# Patient Record
Sex: Female | Born: 1970 | Race: White | Hispanic: No | Marital: Married | Smoking: Never smoker
Health system: Southern US, Community
[De-identification: ages and names within clinical notes are randomized; demographics above are authoritative.]

## PROBLEM LIST (undated history)

## (undated) HISTORY — PX: GASTRECTOMY: SHX58

---

## 2012-09-14 ENCOUNTER — Ambulatory Visit: Payer: Self-pay | Admitting: Family Medicine

## 2012-09-14 IMAGING — CR DG CHEST 2V
1 series · 2 of 2 positions shown · non-contrast
Comparison: none

REASON FOR EXAM: night sweats
COMMENTS:

[Series 1: pa · 0.17mm/px · 2 of 2 slices shown]
[im 1/2]
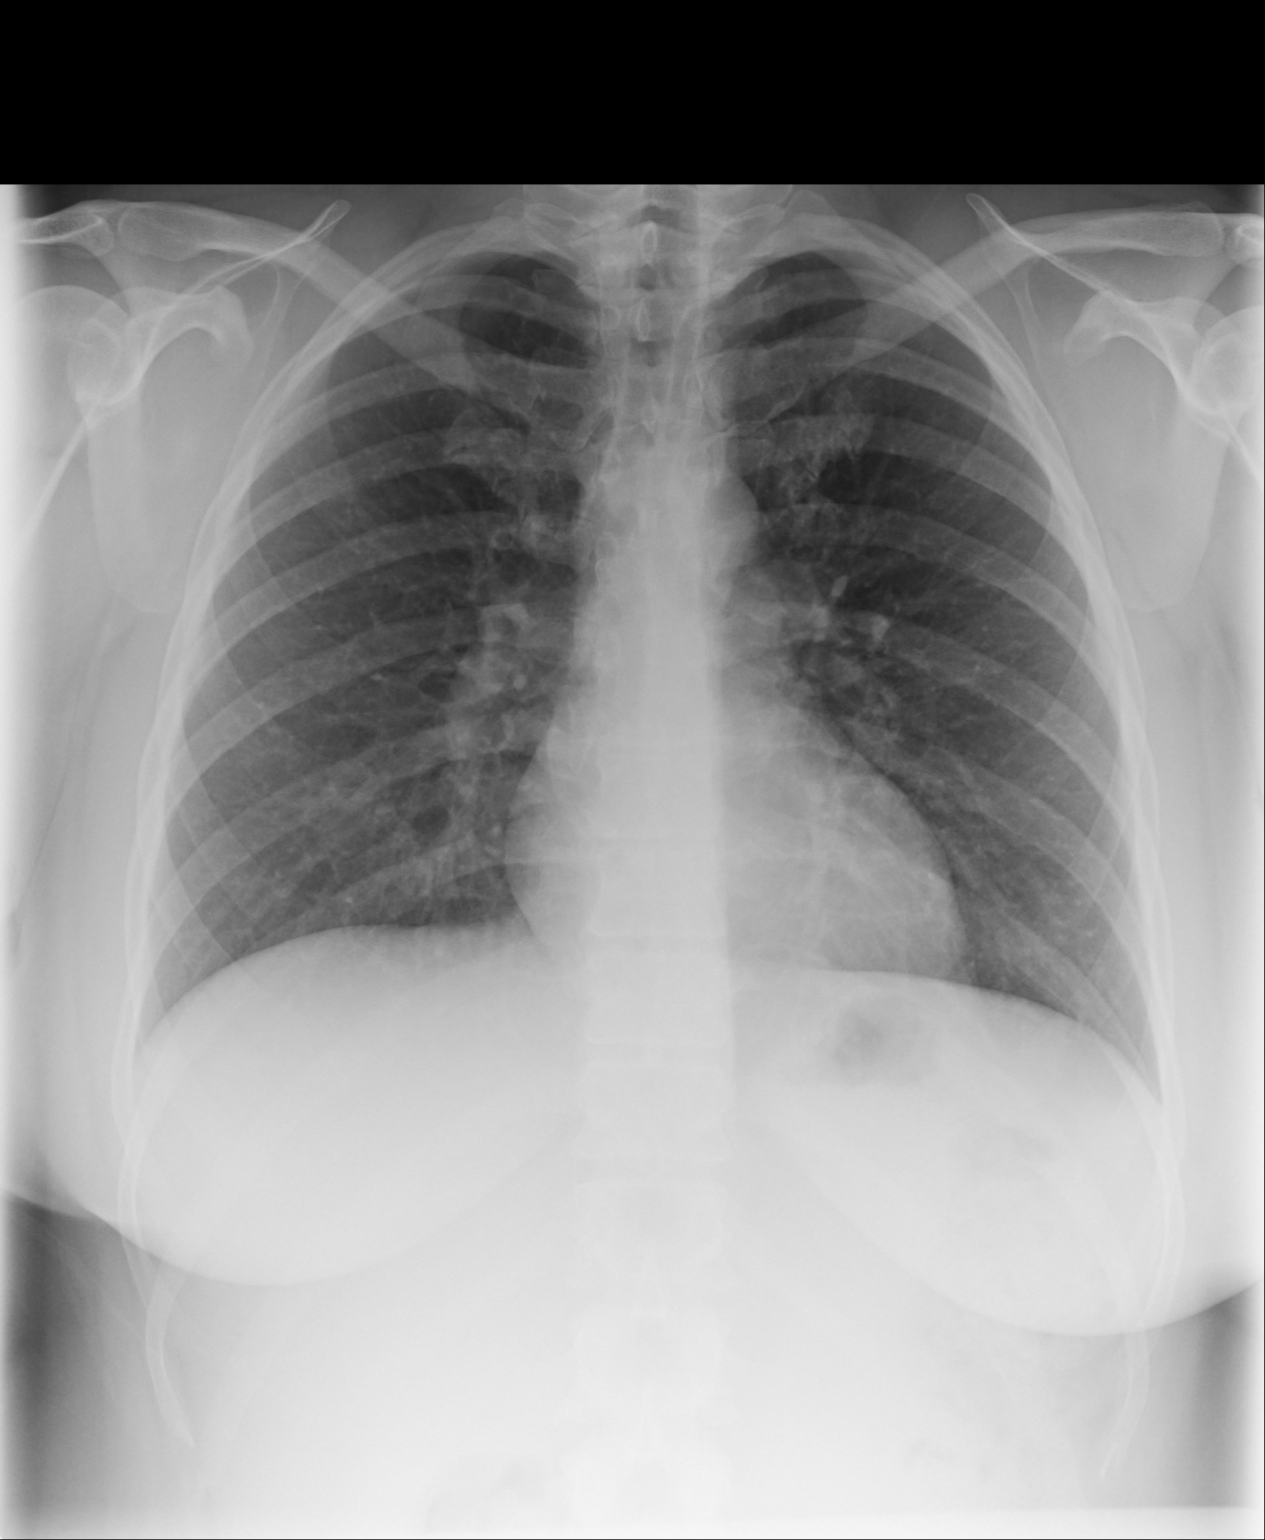
[im 2/2]
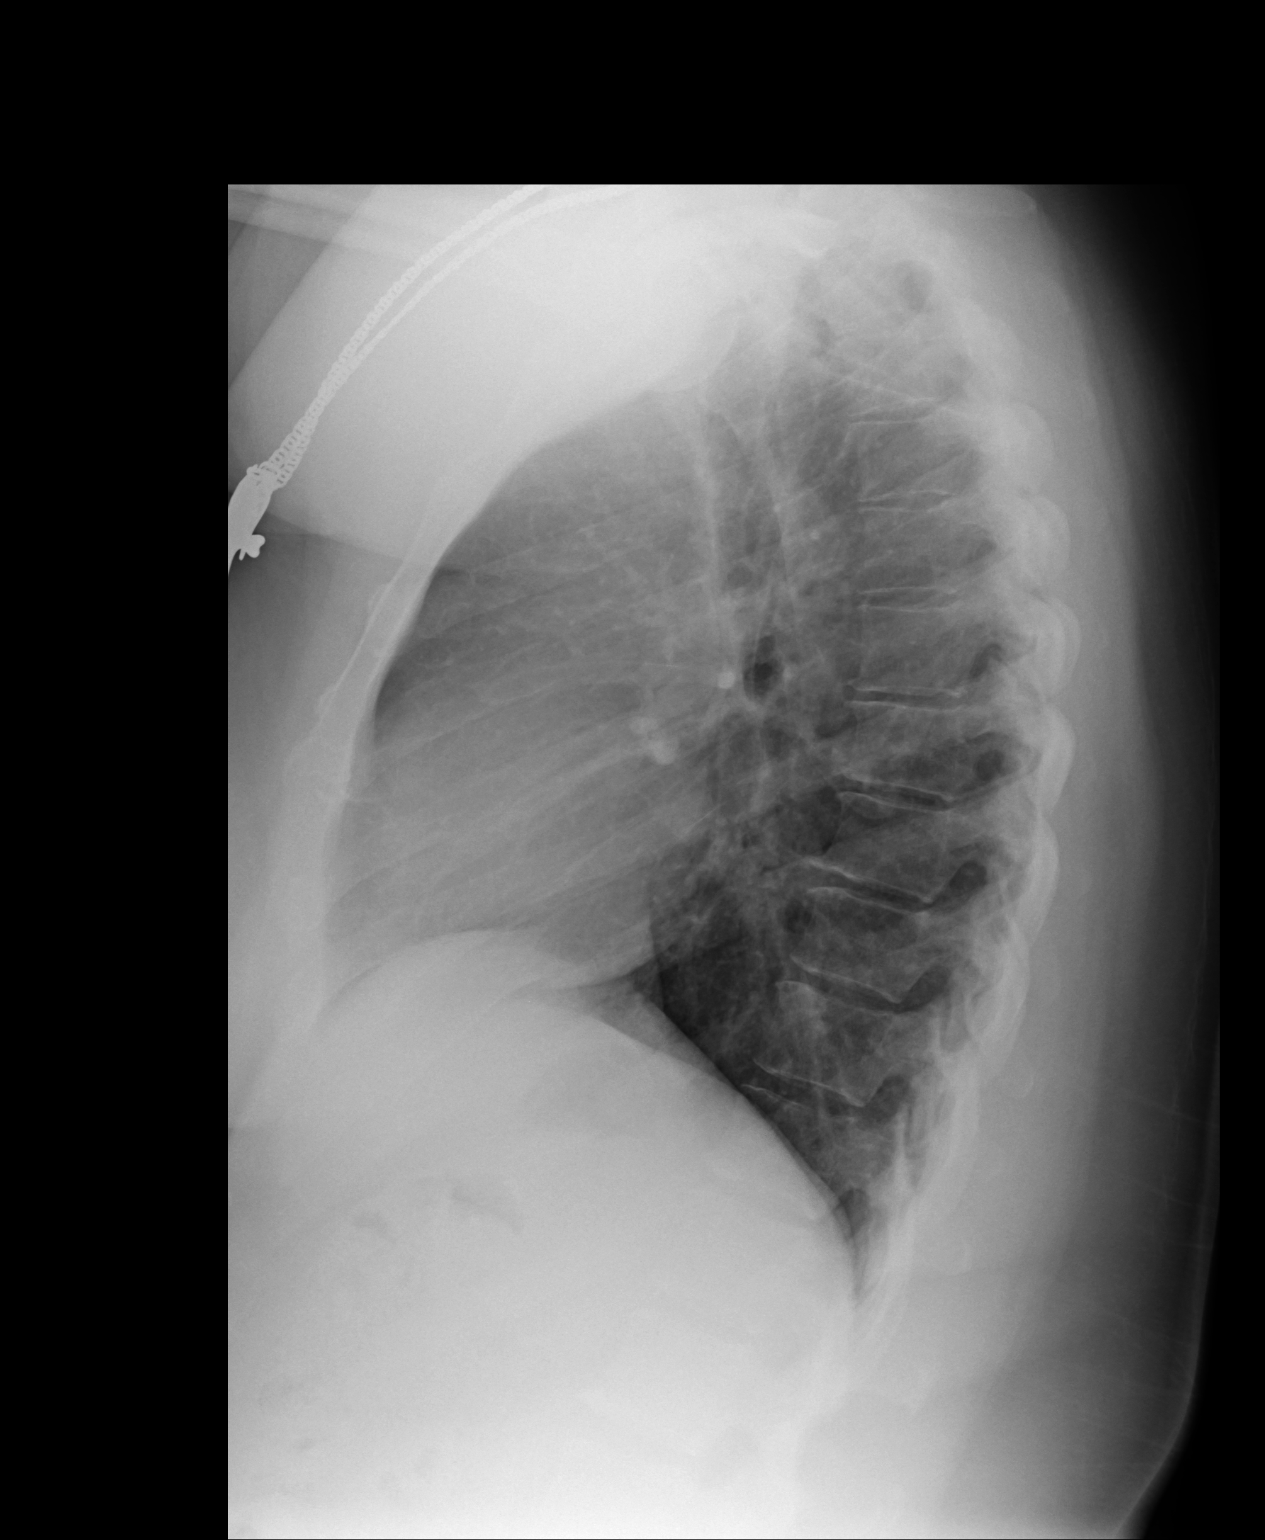

[2 of 2 positions shown; findings below may reference images not displayed]

PROCEDURE:     MDR - MDR CHEST PA(OR AP) AND LATERAL  - [DATE] [DATE]

RESULT:     The lungs are adequately inflated. There is no focal infiltrate.
The cardiac silhouette is normal in size. The mediastinum is normal in
width. There are no findings to suggest hilar or mediastinal
lymphadenopathy. There is no pleural effusion or pneumothorax.
IMPRESSION: There is no definite evidence of acute cardiopulmonary
abnormality. If the patient's systemic symptoms persist and remain
unexplained, followup CT scanning of the chest and abdomen and pelvis would
be useful in an effort to exclude underlying lymphadenopathy or other
pathology.

[REDACTED]

## 2013-07-26 ENCOUNTER — Ambulatory Visit: Payer: Self-pay | Admitting: Specialist

## 2013-07-26 LAB — COMPREHENSIVE METABOLIC PANEL
AST: 20 U/L (ref 15–37)
Albumin: 3.6 g/dL (ref 3.4–5.0)
Alkaline Phosphatase: 94 U/L
Anion Gap: 3 — ABNORMAL LOW (ref 7–16)
BUN: 11 mg/dL (ref 7–18)
Bilirubin,Total: 0.5 mg/dL (ref 0.2–1.0)
CALCIUM: 9 mg/dL (ref 8.5–10.1)
CO2: 31 mmol/L (ref 21–32)
Chloride: 104 mmol/L (ref 98–107)
Creatinine: 0.79 mg/dL (ref 0.60–1.30)
EGFR (African American): >60
EGFR (Non-African Amer.): >60
GLUCOSE: 89 mg/dL (ref 65–99)
OSMOLALITY: 275 (ref 275–301)
POTASSIUM: 4.2 mmol/L (ref 3.5–5.1)
SGPT (ALT): 42 U/L (ref 12–78)
Sodium: 138 mmol/L (ref 136–145)
Total Protein: 8.1 g/dL (ref 6.4–8.2)

## 2013-07-26 LAB — TSH: Thyroid Stimulating Horm: 1.58 u[IU]/mL

## 2013-07-26 LAB — CBC WITH DIFFERENTIAL/PLATELET
BASOS PCT: 0.7 %
Basophil #: 0.1 10*3/uL (ref 0.0–0.1)
EOS ABS: 0.1 10*3/uL (ref 0.0–0.7)
Eosinophil %: 1.3 %
HCT: 42.9 % (ref 35.0–47.0)
HGB: 13.9 g/dL (ref 12.0–16.0)
LYMPHS ABS: 2.8 10*3/uL (ref 1.0–3.6)
Lymphocyte %: 25.2 %
MCH: 27.2 pg (ref 26.0–34.0)
MCHC: 32.3 g/dL (ref 32.0–36.0)
MCV: 84 fL (ref 80–100)
Monocyte #: 0.6 x10 3/mm (ref 0.2–0.9)
Monocyte %: 5.4 %
NEUTROS ABS: 7.5 10*3/uL — AB (ref 1.4–6.5)
NEUTROS PCT: 67.4 %
PLATELETS: 306 10*3/uL (ref 150–440)
RBC: 5.11 10*6/uL (ref 3.80–5.20)
RDW: 14.3 % (ref 11.5–14.5)
WBC: 11.1 10*3/uL — ABNORMAL HIGH (ref 3.6–11.0)

## 2013-07-26 LAB — PROTIME-INR
INR: 1
PROTHROMBIN TIME: 12.6 s (ref 11.5–14.7)

## 2013-07-26 LAB — PHOSPHORUS: PHOSPHORUS: 2.7 mg/dL (ref 2.5–4.9)

## 2013-07-26 LAB — IRON AND TIBC
Iron Bind.Cap.(Total): 364 ug/dL (ref 250–450)
Iron Saturation: 17 %
Iron: 63 ug/dL (ref 50–170)
UNBOUND IRON-BIND. CAP.: 301 ug/dL

## 2013-07-26 LAB — MAGNESIUM: MAGNESIUM: 2 mg/dL

## 2013-07-26 LAB — FERRITIN: Ferritin (ARMC): 119 ng/mL (ref 8–388)

## 2013-07-26 LAB — FOLATE: FOLIC ACID: 16.6 ng/mL (ref 3.1–100.0)

## 2013-07-26 LAB — BILIRUBIN, DIRECT: Bilirubin, Direct: 0.1 mg/dL (ref 0.00–0.20)

## 2013-07-26 LAB — AMYLASE: AMYLASE: 46 U/L (ref 25–115)

## 2013-07-26 LAB — LIPASE, BLOOD: LIPASE: 188 U/L (ref 73–393)

## 2013-07-26 LAB — HEMOGLOBIN A1C: Hemoglobin A1C: 5.8 % (ref 4.2–6.3)

## 2013-07-26 LAB — APTT: Activated PTT: 28.6 secs (ref 23.6–35.9)

## 2013-07-26 IMAGING — CR DG CHEST 2V
1 series · 2 of 2 positions shown · non-contrast
Comparison: DG CHEST 2V dated [DATE]

CLINICAL DATA: Preoperative for bariatric surgery, history of high
cholesterol

EXAM:
CHEST  2 VIEW

[Series 1: w chest lat · 0.14mm/px · 2 of 2 slices shown]
[im 1/2]
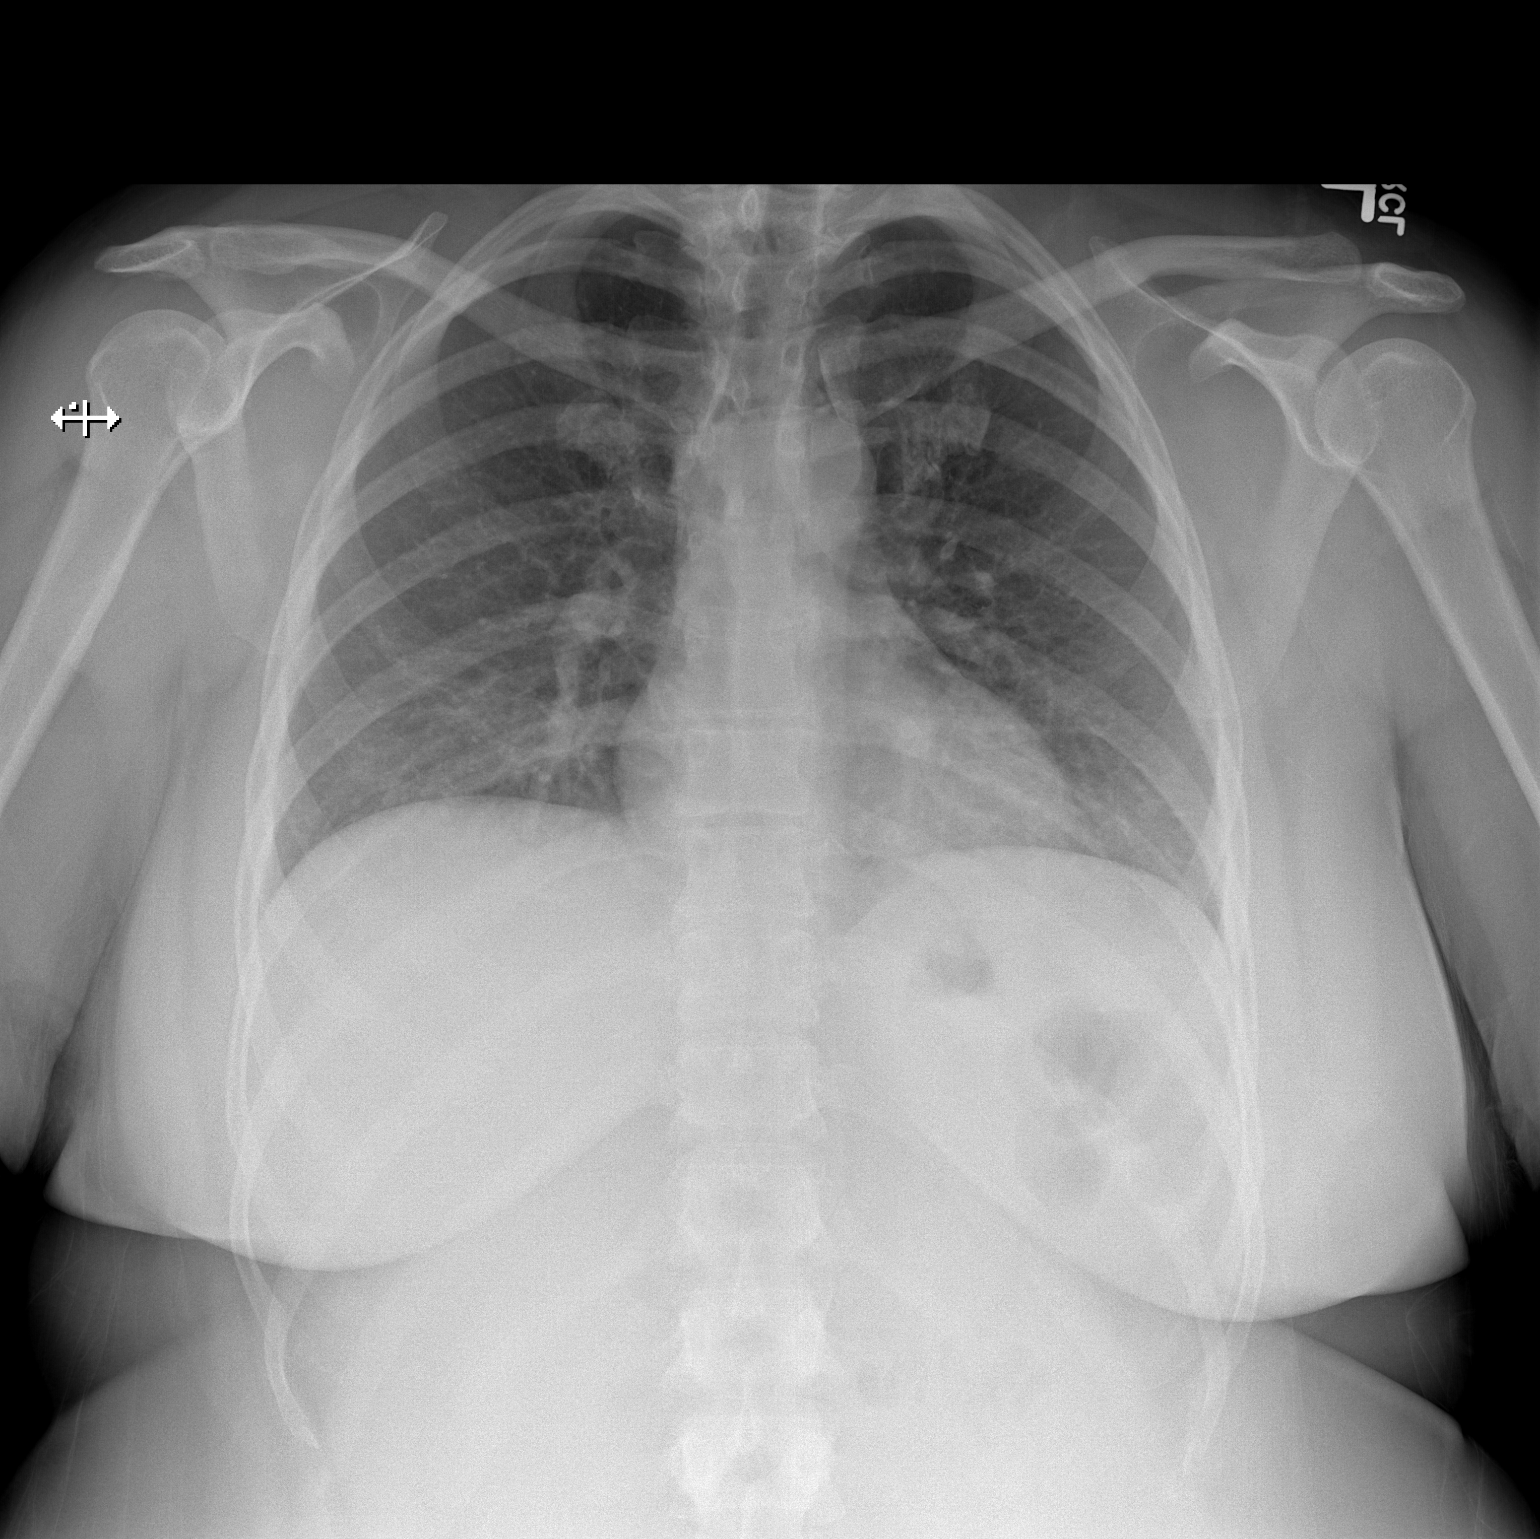
[im 2/2]
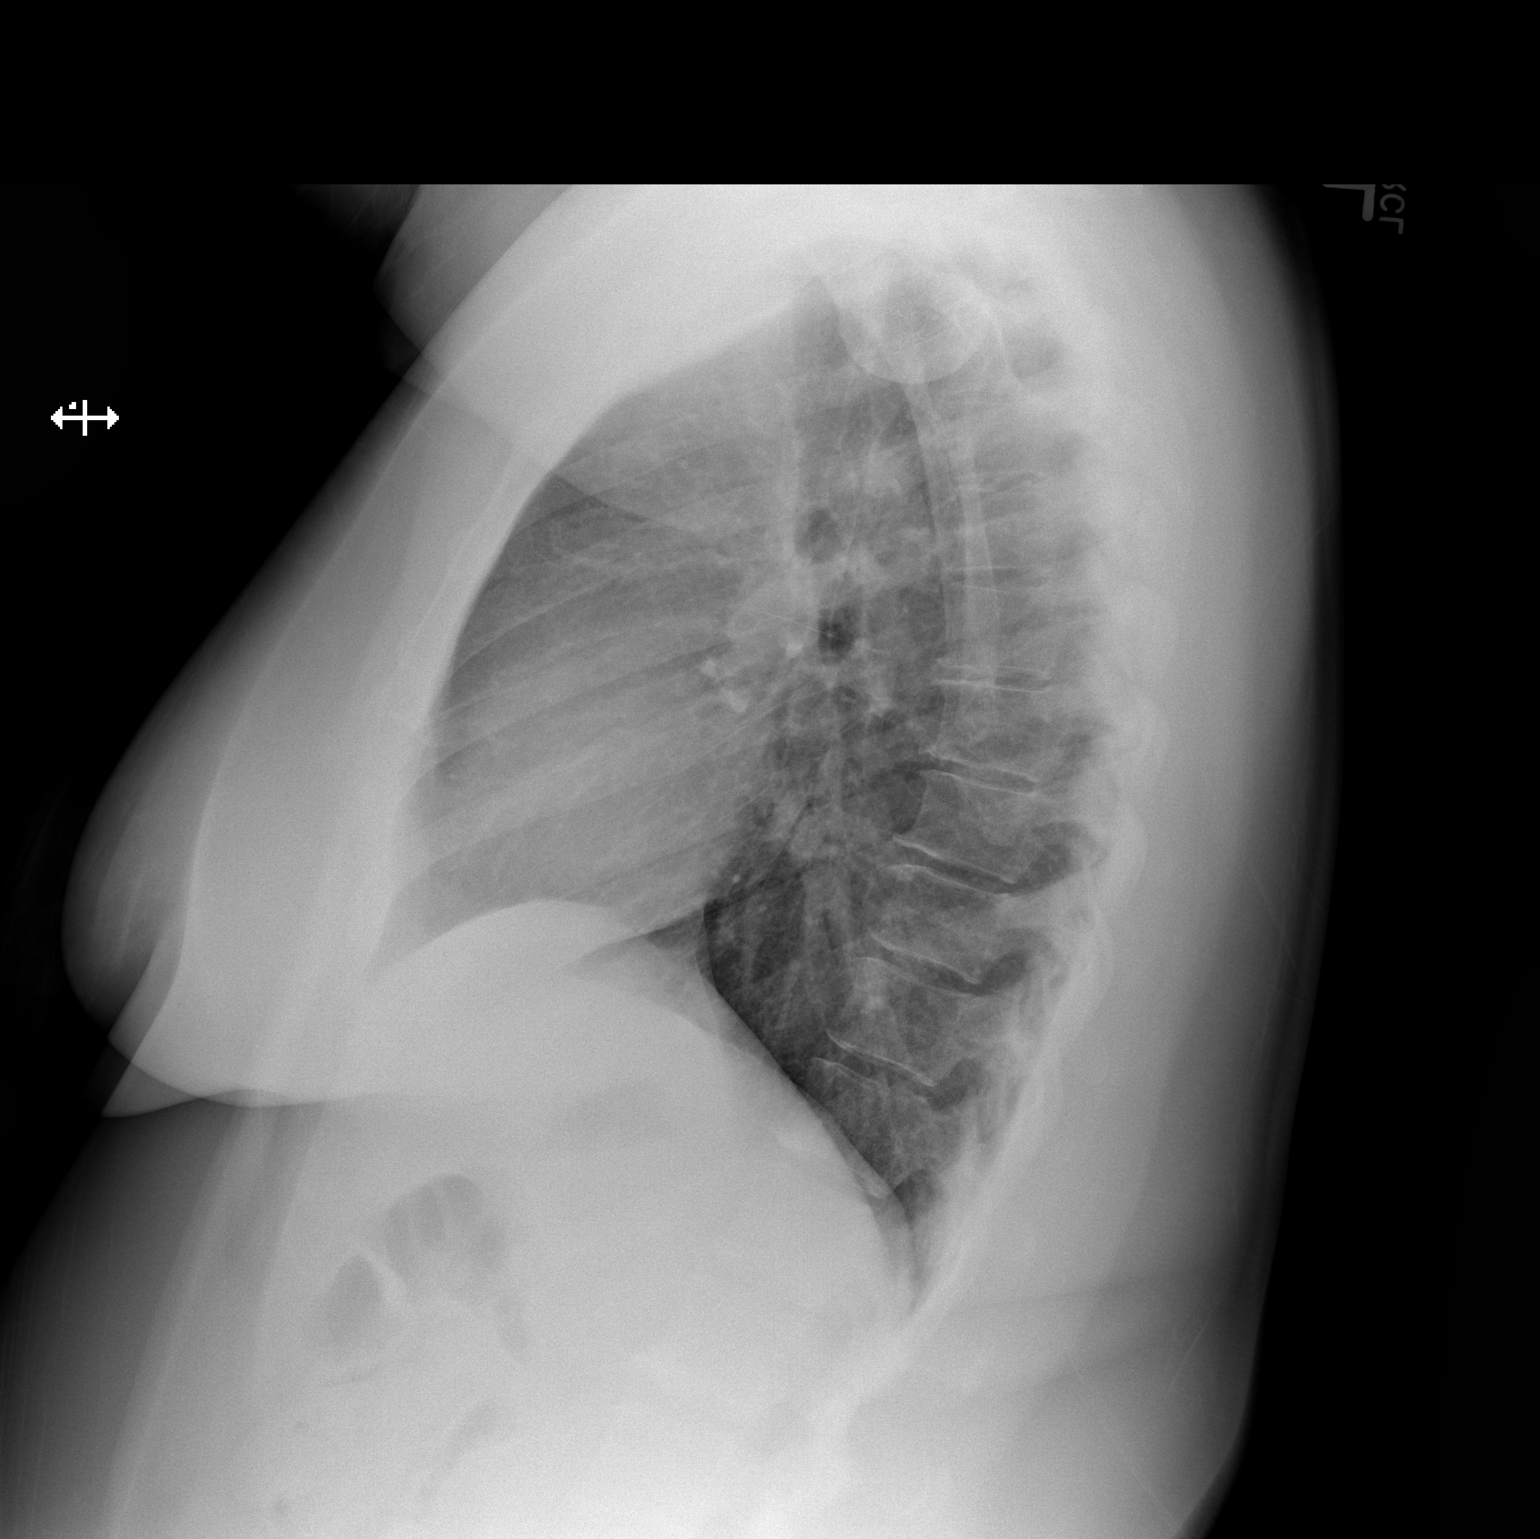

[2 of 2 positions shown; findings below may reference images not displayed]

FINDINGS: The lungs are mildly hypoinflated on the frontal film but better
inflated on the lateral film. There is some crowding of the
interstitial markings on the frontal film which is not seen on the
lateral film. There is no alveolar infiltrate. There is no pleural
effusion or pneumothorax. The cardiac silhouette is normal in size.
The pulmonary vascularity is not engorged. The mediastinum is normal
in width. The observed portions of the bony thorax appear normal.
The gastric air bubble is on the left.
IMPRESSION: There is no evidence of active cardiopulmonary disease.

## 2013-07-26 IMAGING — RF DG UGI W/O KUB
7 series · 15 of 19 positions shown · non-contrast
Comparison: None.

FLUOROSCOPY TIME:  2 min 0 seconds

CLINICAL DATA: Reflux and hiatal hernia. Workup for bariatric
surgery.

EXAM:
UPPER GI SERIES WITHOUT KUB
TECHNIQUE: Routine upper GI series was performed with thin/high density/water
soluble barium.

[Series 1: fluoro_barium 2fps_bw · 0.17mm/px · 1 of 1 slices shown (1 of 7)]
[im 1/1]
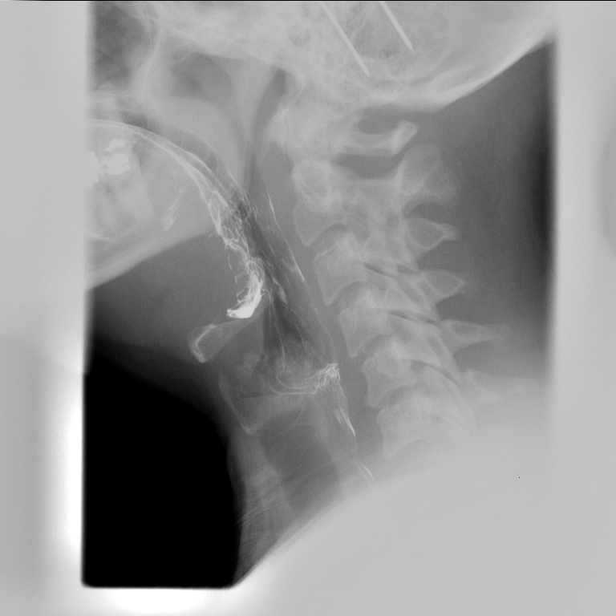

[Series 2: fluoro_barium 2fps_bw · 0.17mm/px · 2 of 6 frames shown (2 of 7)]
[frame 1/6]
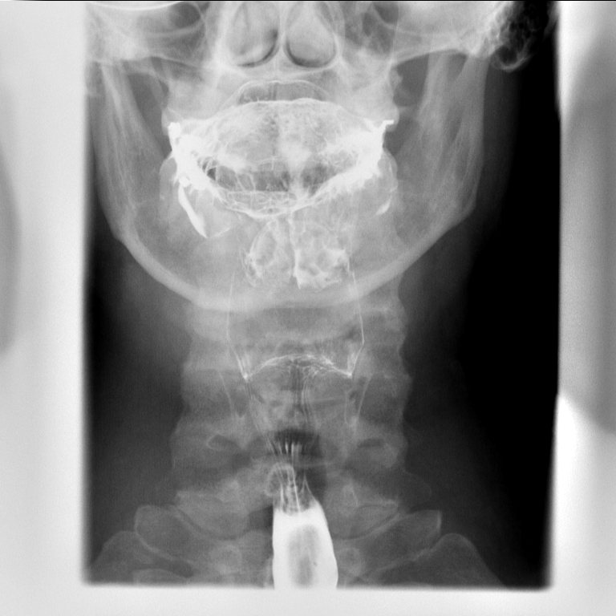
[frame 6/6]
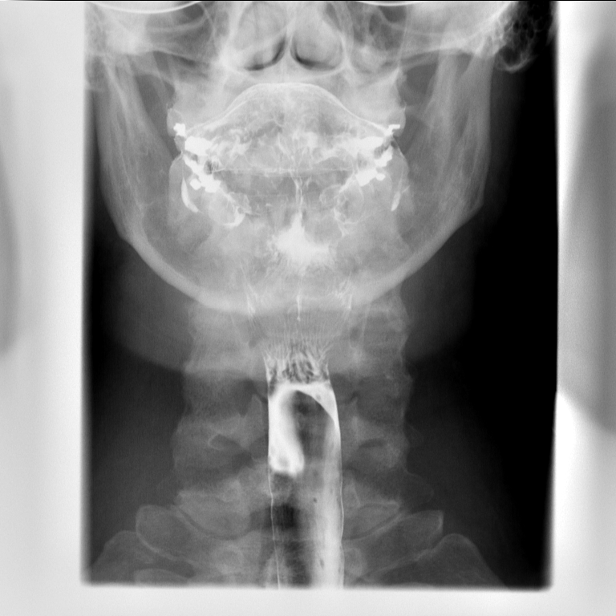

[Series 3: fluoro_barium 2fps_bw · 0.17mm/px · 1 of 1 slices shown (3 of 7)]
[im 1/1]
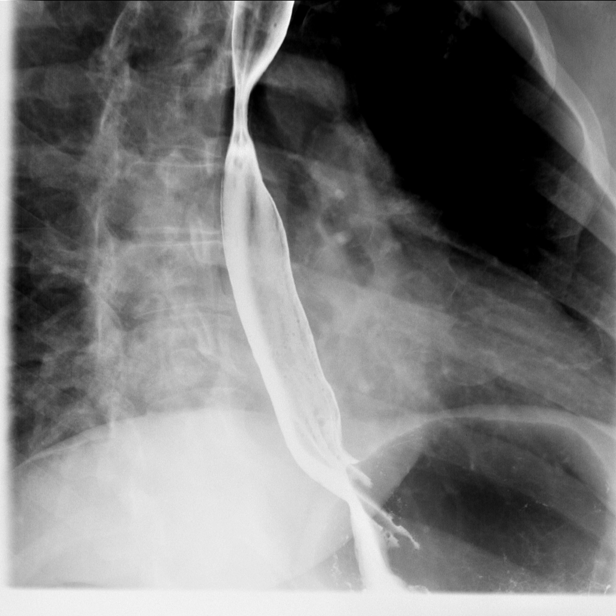

[Series 4: fluoro_barium 2fps_bw · 0.17mm/px · 1 of 1 slices shown (4 of 7)]
[im 1/1]
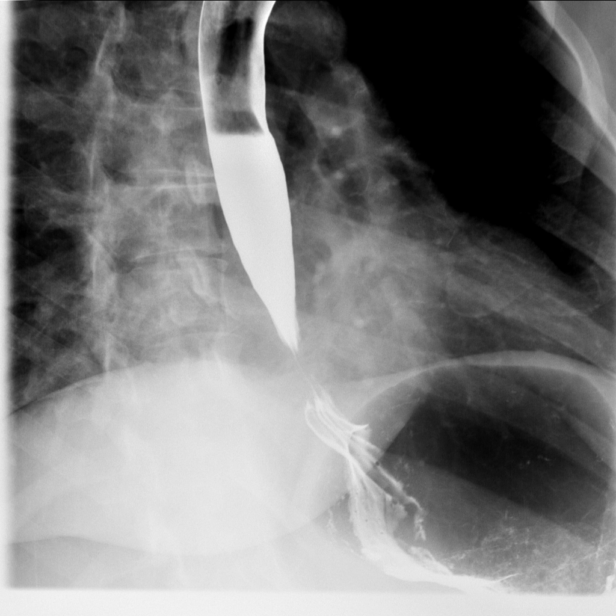

[Series 5: fluoro_barium 2fps_bw · 0.17mm/px · 1 of 2 frames shown (5 of 7)]
[frame 1/2]
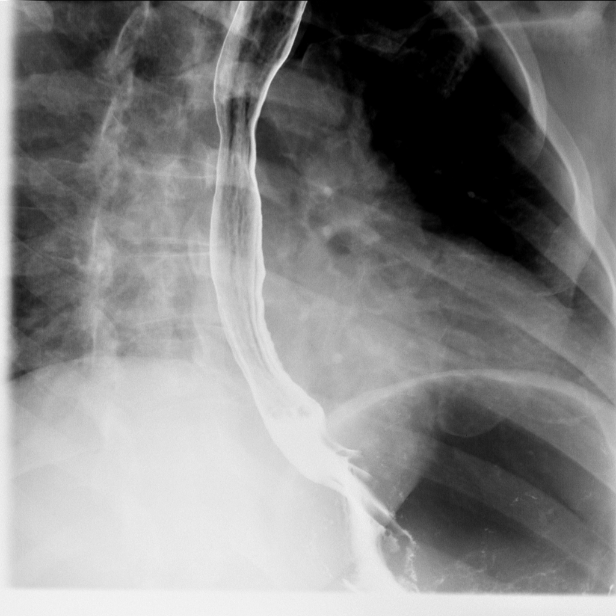

[Series 6: fluoro_barium 2fps_bw · 0.17mm/px · 1 of 1 slices shown (6 of 7)]
[im 1/1]
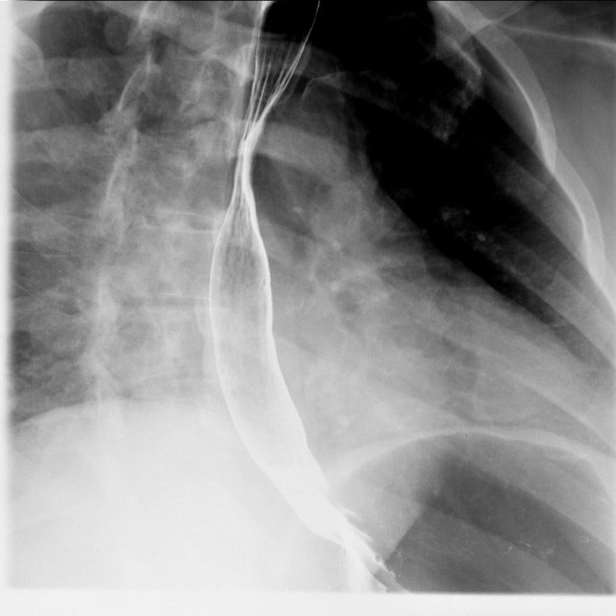

[Series 7: fluoro_barium 2fps_bw · 0.18mm/px · 8 of 10 slices shown (7 of 7)]
[im 1/10]
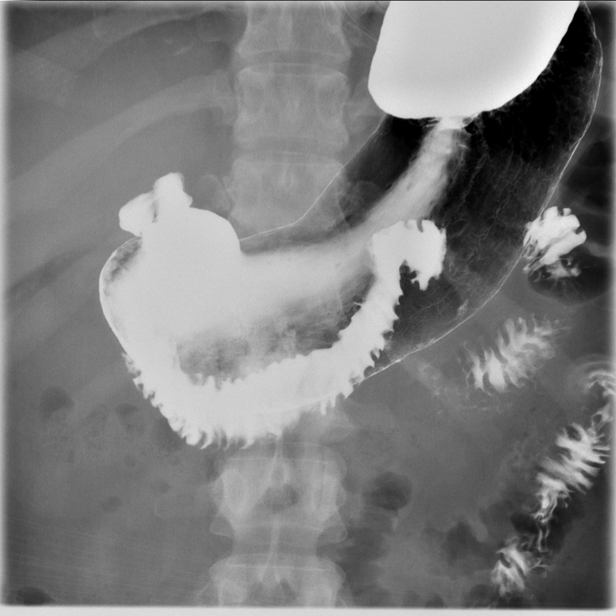
[im 2/10]
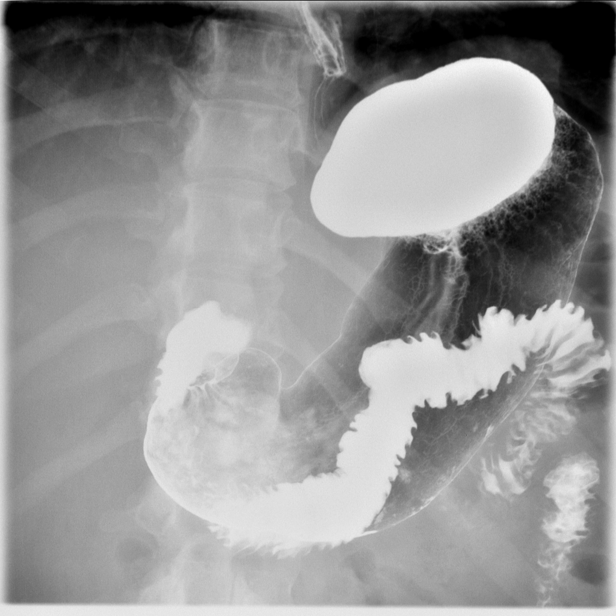
[im 4/10]
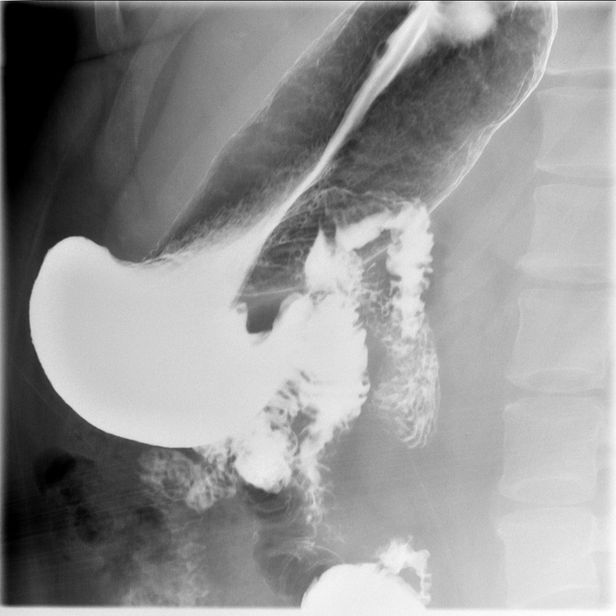
[im 5/10]
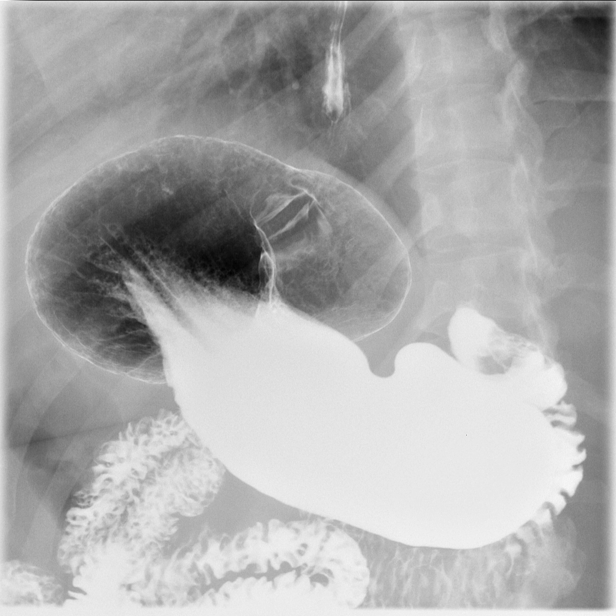
[im 6/10]
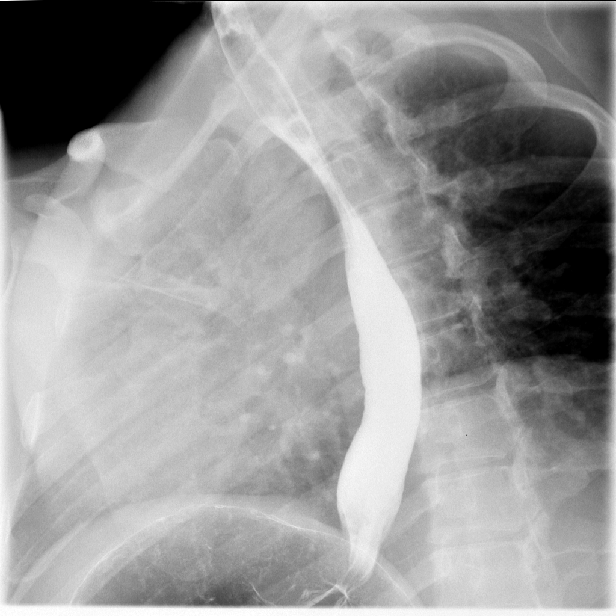
[im 7/10]
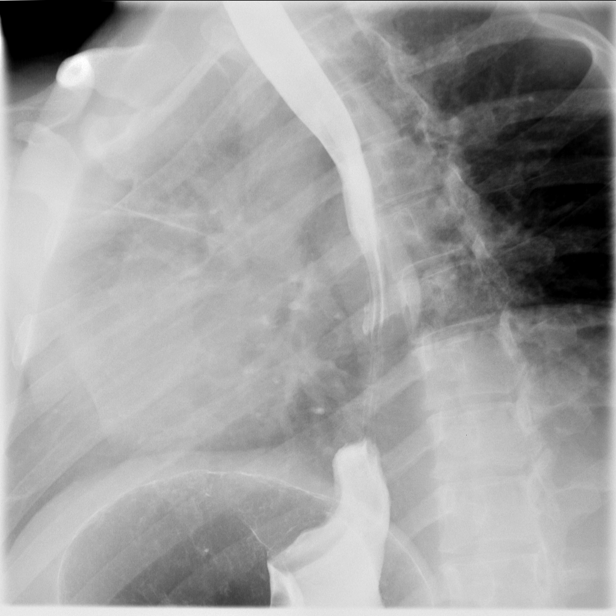
[im 9/10]
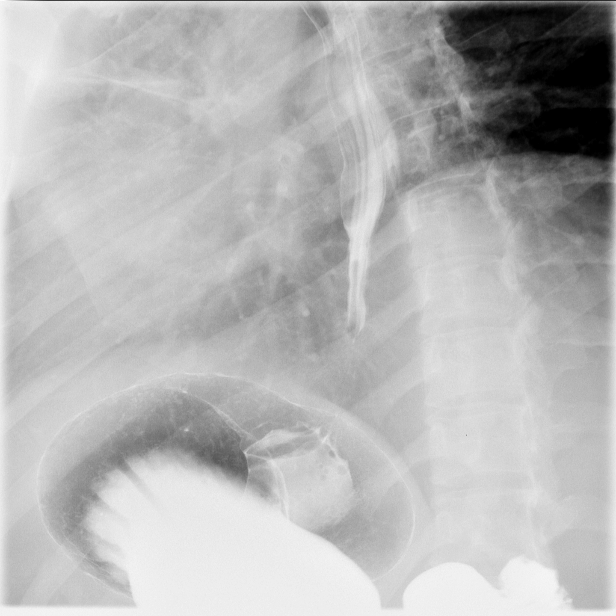
[im 10/10]
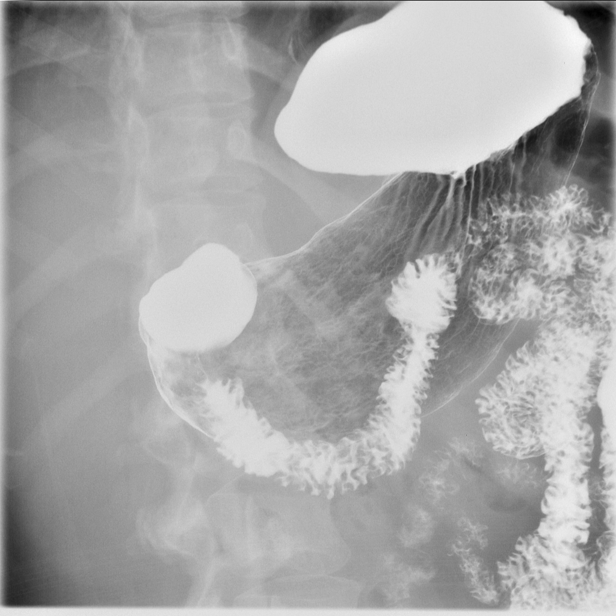

[15 of 19 positions shown; findings below may reference images not displayed]

FINDINGS: Laryngeal motility is normal. No significant hiatal hernia is
present.

Of the patient in the prone position, contrast study may be within
the esophagus and was slowly cleared. The patient stated she felt
somewhat full. Mild reflux was also evident.

The stomach and duodenum are within normal limits. The small bowel
is normally rotated.
IMPRESSION: 1. Esophageal dysmotility with contrast stagnation in the esophagus
in the prone position.
2. Esophageal reflux.
3. No significant hiatal hernia.

## 2013-07-26 IMAGING — US ABDOMEN ULTRASOUND LIMITED
1 series · 14 of 25 positions shown · non-contrast
Comparison: None.

CLINICAL DATA: Morbid obesity

EXAM:
US ABDOMEN LIMITED - RIGHT UPPER QUADRANT

[Series 1: abdomen ultrasound limited · 0.28mm/px · 14 of 46 slices shown]
[im 1/46]
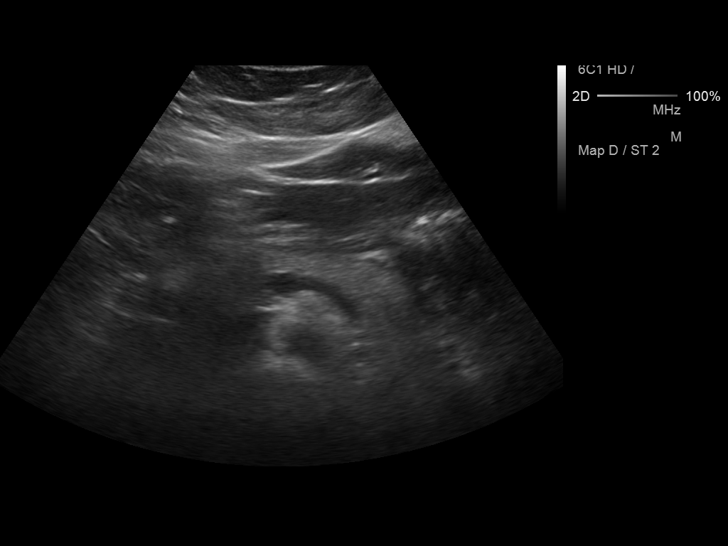
[im 4/46]
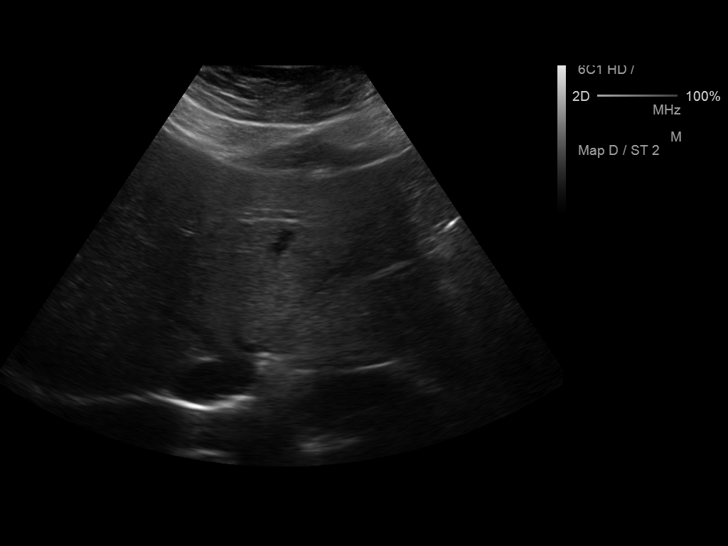
[im 8/46]
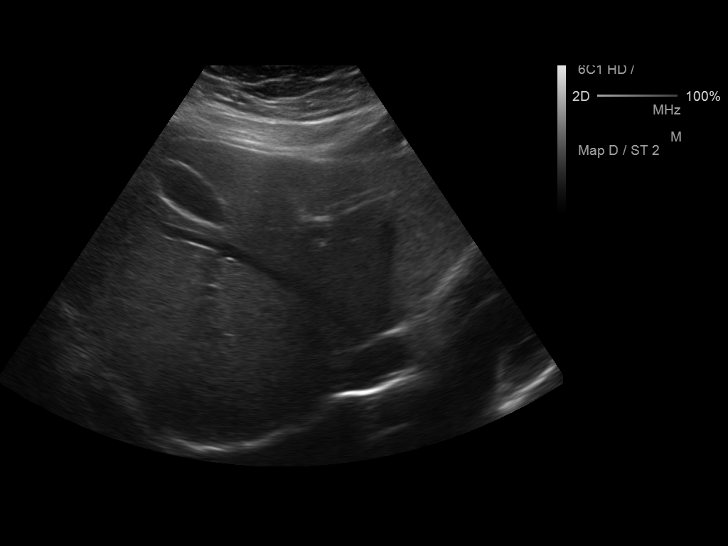
[im 12/46]
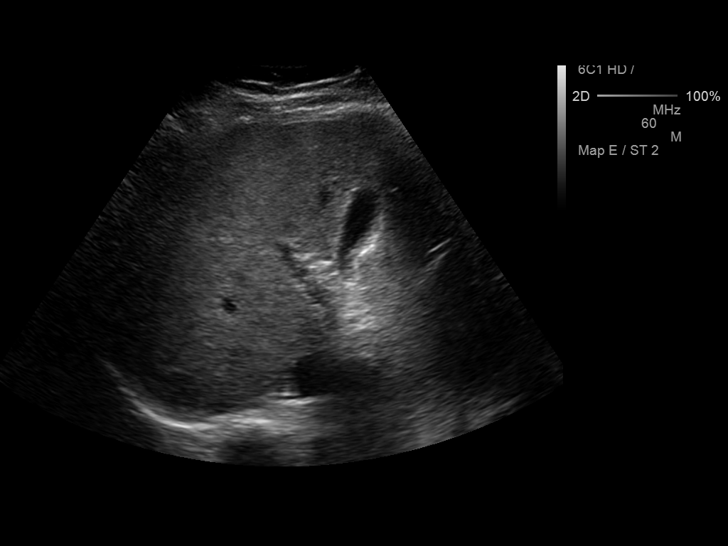
[im 16/46]
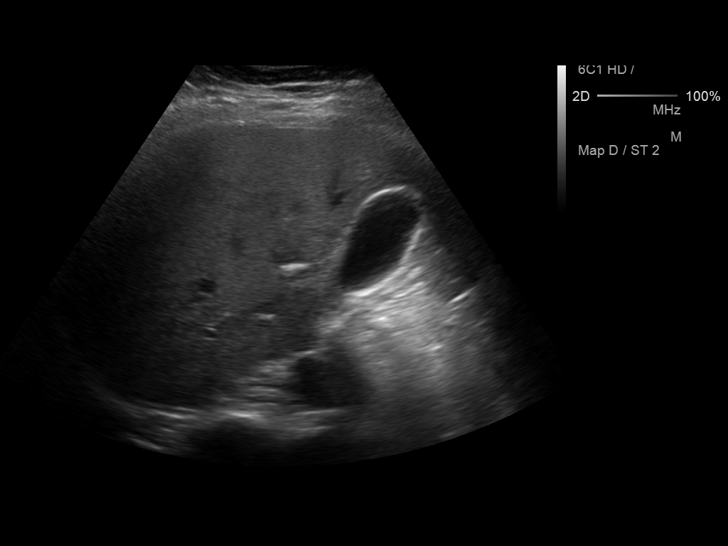
[im 17/46]
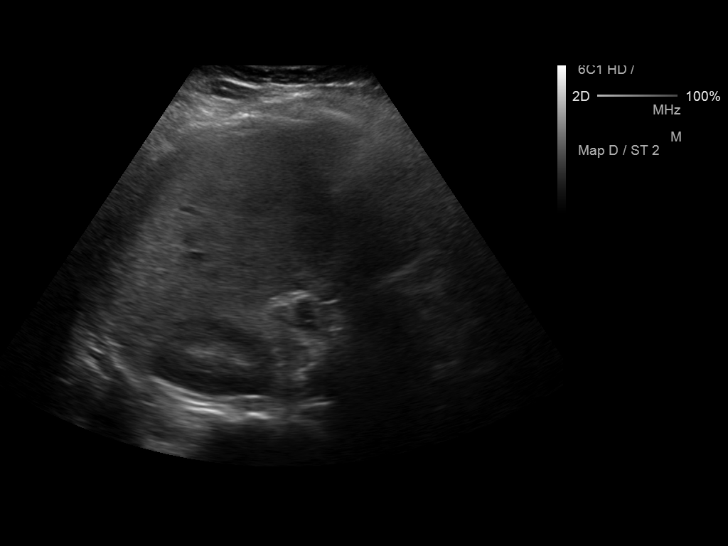
[im 21/46]
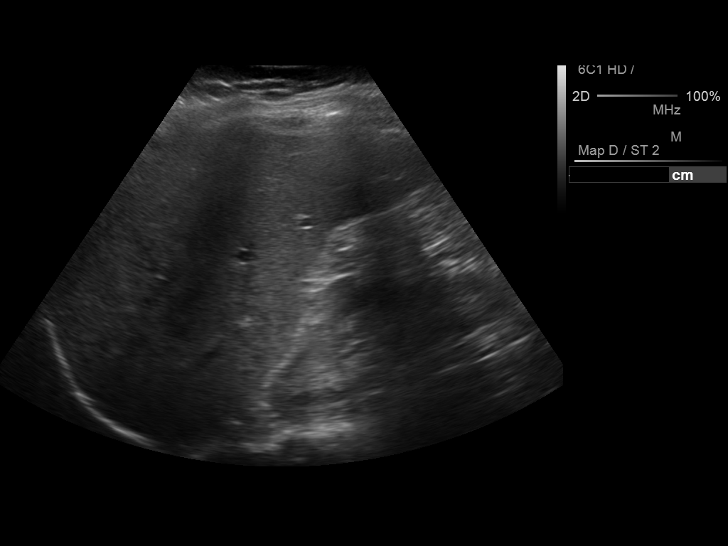
[im 25/46]
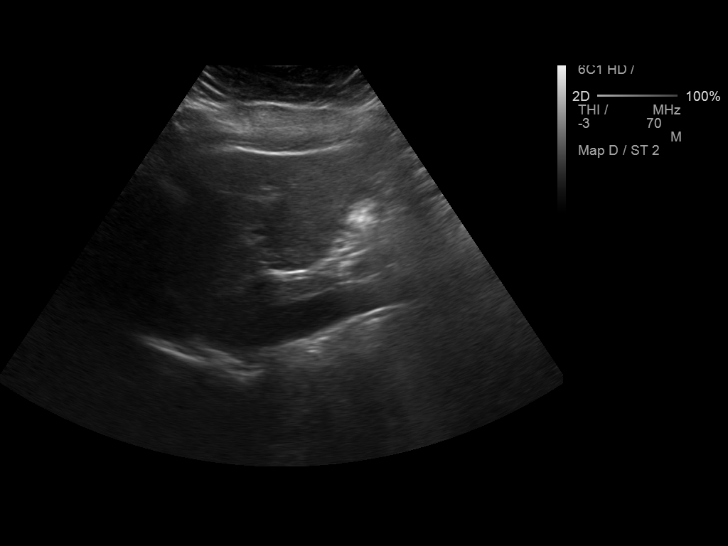
[im 29/46]
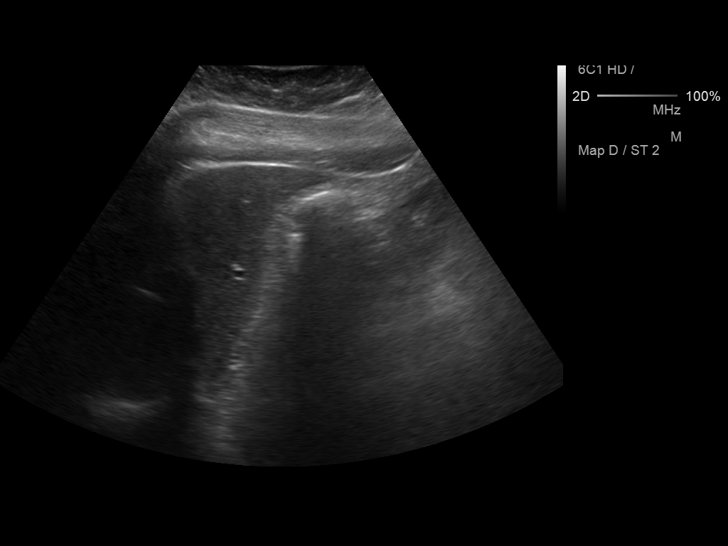
[im 31/46]
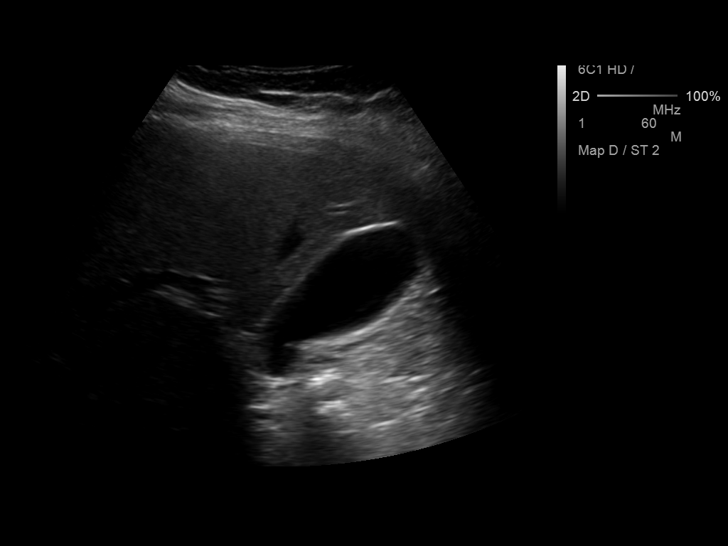
[im 34/46]
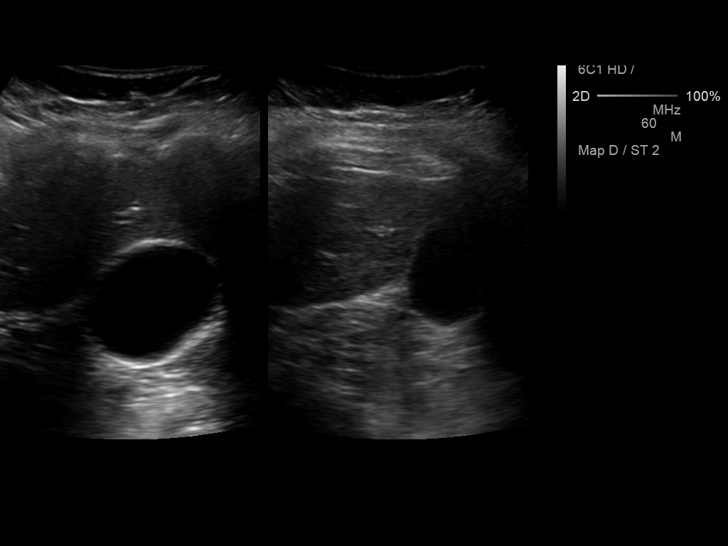
[im 38/46]
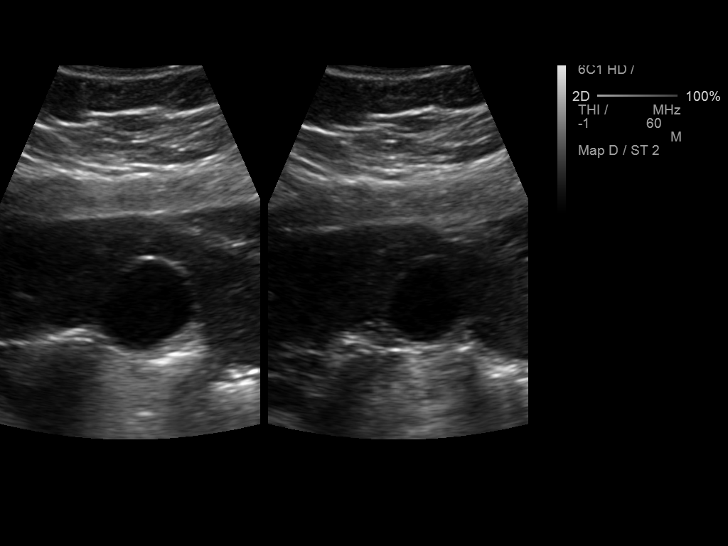
[im 42/46]
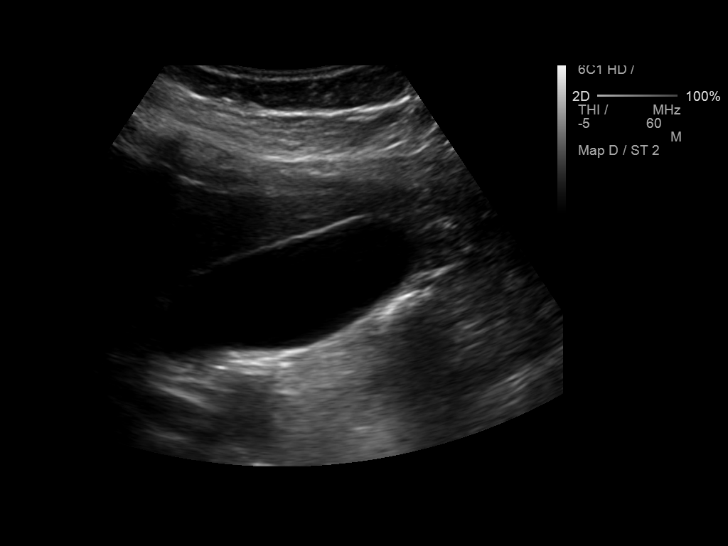
[im 46/46]
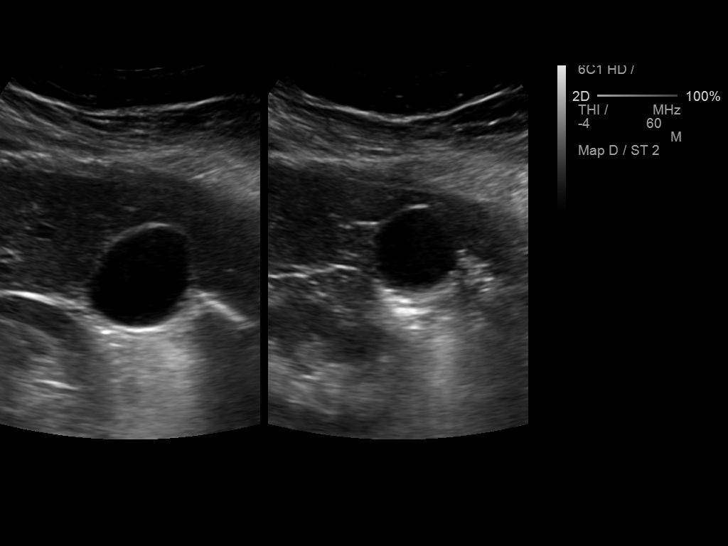

[14 of 25 positions shown; findings below may reference images not displayed]

FINDINGS: Gallbladder:

No gallstones or wall thickening visualized. There is no
pericholecystic fluid. No sonographic Murphy sign noted.

Common bile duct:

Diameter: 3 mm. There is no or extrahepatic biliary duct dilatation.

Liver:

Liver is prominent, measuring 18.1 cm in length. There is fatty
infiltration in the liver. No focal liver lesions are identified.
IMPRESSION: Liver enlarged with fatty change. No focal liver lesions
appreciated. No demonstrable gallbladder pathology.

## 2013-07-31 ENCOUNTER — Ambulatory Visit: Payer: Self-pay | Admitting: Specialist

## 2013-08-04 ENCOUNTER — Ambulatory Visit: Payer: Self-pay | Admitting: Specialist

## 2013-08-06 ENCOUNTER — Ambulatory Visit: Payer: Self-pay | Admitting: Gastroenterology

## 2013-08-08 LAB — PATHOLOGY REPORT

## 2019-06-25 ENCOUNTER — Other Ambulatory Visit: Payer: Self-pay

## 2019-06-25 ENCOUNTER — Ambulatory Visit
Admission: EM | Admit: 2019-06-25 | Discharge: 2019-06-25 | Disposition: A | Discharge status: Home / Self Care | Payer: BC Managed Care – PPO | Attending: Emergency Medicine | Admitting: Emergency Medicine

## 2019-06-25 ENCOUNTER — Encounter: Payer: Self-pay | Admitting: Emergency Medicine

## 2019-06-25 DIAGNOSIS — R300 Dysuria: Secondary | ICD-10-CM | POA: Diagnosis not present

## 2019-06-25 DIAGNOSIS — N390 Urinary tract infection, site not specified: Secondary | ICD-10-CM | POA: Insufficient documentation

## 2019-06-25 LAB — POCT URINALYSIS DIP (MANUAL ENTRY)
Bilirubin, UA: NEGATIVE
Glucose, UA: NEGATIVE mg/dL
Ketones, POC UA: NEGATIVE mg/dL
Nitrite, UA: NEGATIVE
Protein Ur, POC: NEGATIVE mg/dL
Spec Grav, UA: 1.01 (ref 1.010–1.025)
Urobilinogen, UA: 0.2 E.U./dL
pH, UA: 6.5 (ref 5.0–8.0)

## 2019-06-25 MED ORDER — CEPHALEXIN 500 MG PO CAPS: 500.0000 mg | ORAL_CAPSULE | Freq: Two times a day (BID) | ORAL | 0 refills | Status: AC

## 2019-06-25 NOTE — Discharge Instructions (Addendum)
Your urine shows some indication of a urinary tract infection.  A urine culture is pending and we will call you if your antibiotic needs to be changed.    Take the antibiotic as directed.    Follow-up with your primary care provider if your symptoms are not improving.

## 2019-06-25 NOTE — ED Provider Notes (Signed)
[facility]    CSN: [id number] Arrival date & time: 06/25/19  1206      History   Chief Complaint Chief Complaint  Patient presents with  . Urinary Tract Infection    HPI Sharon CrockerBarbara L Spano is a 48 y.o. female.   Patient presents with 4-day history of dysuria and urinary frequency.  She has been treating her symptoms at home with cranberry juice and Tylenol.  She denies fever, chills, abdominal pain, flank pain, pelvic pain, or other symptoms.    The history is provided by the patient.    No past medical history on file.  There are no problems to display for this patient.   Past Surgical History:  Procedure Laterality Date  . GASTRECTOMY      OB History   No obstetric history on file.      Home Medications    Prior to Admission medications   Medication Sig Start Date End Date Taking? Authorizing Provider  amitriptyline (ELAVIL) 100 MG tablet Take by mouth. 04/25/19  Yes [provider]  buPROPion (WELLBUTRIN) 100 MG tablet Take by mouth. 04/23/19  Yes [provider]  butalbital-acetaminophen-caffeine (FIORICET) 50-325-40 MG tablet Take by mouth. 11/15/16  Yes [provider]  dexlansoprazole (DEXILANT) 60 MG capsule Take by mouth. 06/16/16  Yes [provider]  diphenoxylate-atropine (LOMOTIL) 2.5-0.025 MG tablet Take by mouth. 04/24/19  Yes [provider]  dronabinol (MARINOL) 5 MG capsule Take by mouth. 04/23/19 07/22/19 Yes [provider]  Hyoscyamine Sulfate SL 0.125 MG SUBL Take by mouth. 06/20/18  Yes [provider]  morphine 10 MG/5ML solution Take by mouth. 06/10/19  Yes [provider]  promethazine (PHENERGAN) 25 MG suppository  05/28/18  Yes [provider]  sucralfate (CARAFATE) 1 GM/10ML suspension Take by mouth. 04/27/18  Yes [provider]  acarbose (PRECOSE) 25 MG tablet Take by mouth.    [provider]  acetaminophen (TYLENOL) 160  MG/5ML solution Take by mouth.    [provider]  Calcium Carb-Cholecalciferol 500-400 MG-UNIT CHEW Chew by mouth.    [provider]  cephALEXin (KEFLEX) 500 MG capsule Take 1 capsule (500 mg total) by mouth 2 (two) times daily for 5 days. 06/25/19 06/30/19  Mickie Bailate, Emillee Talsma H, NP  cyanocobalamin (,VITAMIN B-12,) 1000 MCG/ML injection cyanocobalamin (vit B-12) 1,000 mcg/mL injection solution every 60 days    [provider]  Multiple Vitamins tablet Take by mouth.    [provider]  Promethazine HCl 6.25 MG/5ML SOLN  06/04/19   [provider]    Family History No family history on file.  Social History Social History   Tobacco Use  . Smoking status: Never Smoker  . Smokeless tobacco: Never Used  Substance Use Topics  . Alcohol use: Yes  . Drug use: Never     Allergies   Nsaids   Review of Systems Review of Systems  Constitutional: Negative for chills and fever.  HENT: Negative for ear pain and sore throat.   Eyes: Negative for pain and visual disturbance.  Respiratory: Negative for cough and shortness of breath.   Cardiovascular: Negative for chest pain and palpitations.  Gastrointestinal: Negative for abdominal pain and vomiting.  Genitourinary: Positive for dysuria and frequency. Negative for flank pain, hematuria, pelvic pain and vaginal discharge.  Musculoskeletal: Negative for arthralgias and back pain.  Skin: Negative for color change and rash.  Neurological: Negative for seizures and syncope.  All other systems reviewed and are negative.  Physical Exam Triage Vital Signs ED Triage Vitals  Enc Vitals Group     BP      Pulse      Resp      Temp      Temp src      SpO2      Weight      Height      Head Circumference      Peak Flow      Pain Score      Pain Loc      Pain Edu?      Excl. in GC?    No data found.  Updated Vital Signs BP (!) 144/80   Pulse 96   Temp 98.3 F (36.8 C)   Resp 18   Wt 148  lb (67.1 kg)   LMP 06/18/2019   SpO2 97%   Visual Acuity Right Eye Distance:   Left Eye Distance:   Bilateral Distance:    Right Eye Near:   Left Eye Near:    Bilateral Near:     Physical Exam Vitals and nursing note reviewed.  Constitutional:      General: She is not in acute distress.    Appearance: She is well-developed. She is not ill-appearing.  HENT:     Head: Normocephalic and atraumatic.     Mouth/Throat:     Mouth: Mucous membranes are moist.     Pharynx: Oropharynx is clear.  Eyes:     Conjunctiva/sclera: Conjunctivae normal.  Cardiovascular:     Rate and Rhythm: Normal rate and regular rhythm.     Heart sounds: No murmur.  Pulmonary:     Effort: Pulmonary effort is normal. No respiratory distress.     Breath sounds: Normal breath sounds.  Abdominal:     General: Bowel sounds are normal.     Palpations: Abdomen is soft.     Tenderness: There is no abdominal tenderness. There is no right CVA tenderness, left CVA tenderness, guarding or rebound.  Musculoskeletal:     Cervical back: Neck supple.  Skin:    General: Skin is warm and dry.     Findings: No rash.  Neurological:     General: No focal deficit present.     Mental Status: She is alert and oriented to person, place, and time.  Psychiatric:        Mood and Affect: Mood normal.        Behavior: Behavior normal.      UC Treatments / Results  Labs (all labs ordered are listed, but only abnormal results are displayed) Labs Reviewed  POCT URINALYSIS DIP (MANUAL ENTRY) - Abnormal; Notable for the following components:      Result Value   Blood, UA trace-lysed (*)    Leukocytes, UA Large (3+) (*)    All other components within normal limits  URINE CULTURE    EKG   Radiology No results found.  Procedures Procedures (including critical care time)  Medications Ordered in UC Medications - No data to display  Initial Impression / Assessment and Plan / UC Course  I have reviewed the triage  vital signs and the nursing notes.  Pertinent labs & imaging results that were available during my care of the patient were reviewed by me and considered in my medical decision making (see chart for details).   UTI.  Patient is well-appearing and her exam is unremarkable.  Urine culture pending.  Treating with Keflex.  Discussed with patient that we will call  her if the antibiotic needs to be changed based on the C&S results.  Instructed her to follow-up with her PCP if her symptoms are not improving.  Patient agrees to plan of care.  Final Clinical Impressions(s) / UC Diagnoses   Final diagnoses:  Urinary tract infection without hematuria, site unspecified     Discharge Instructions     Your urine shows some indication of a urinary tract infection.  A urine culture is pending and we will call you if your antibiotic needs to be changed.    Take the antibiotic as directed.    Follow-up with your primary care provider if your symptoms are not improving.        ED Prescriptions    Medication Sig Dispense Auth. Provider   cephALEXin (KEFLEX) 500 MG capsule Take 1 capsule (500 mg total) by mouth 2 (two) times daily for 5 days. 10 capsule Sharion Balloon, NP     PDMP not reviewed this encounter.   Sharion Balloon, NP 06/25/19 1302

## 2019-06-25 NOTE — ED Triage Notes (Signed)
Patient in office today c/o urinary issues since 06/21/19 pain and frequency  OTC: cranberry juice, Tylenol  Denies: fever

## 2019-06-27 LAB — URINE CULTURE: Culture: >=100000 — AB

## 2019-07-20 ENCOUNTER — Other Ambulatory Visit: Payer: Self-pay

## 2019-07-20 ENCOUNTER — Observation Stay: Payer: BC Managed Care – PPO

## 2019-07-20 ENCOUNTER — Encounter: Payer: Self-pay | Admitting: Emergency Medicine

## 2019-07-20 ENCOUNTER — Observation Stay
Admission: EM | Admit: 2019-07-20 | Discharge: 2019-07-21 | Disposition: A | Discharge status: Home / Self Care | Payer: BC Managed Care – PPO | Attending: Family Medicine | Admitting: Family Medicine

## 2019-07-20 ENCOUNTER — Emergency Department: Payer: BC Managed Care – PPO

## 2019-07-20 DIAGNOSIS — G4733 Obstructive sleep apnea (adult) (pediatric): Secondary | ICD-10-CM | POA: Diagnosis not present

## 2019-07-20 DIAGNOSIS — K589 Irritable bowel syndrome without diarrhea: Secondary | ICD-10-CM | POA: Diagnosis not present

## 2019-07-20 DIAGNOSIS — F329 Major depressive disorder, single episode, unspecified: Secondary | ICD-10-CM | POA: Insufficient documentation

## 2019-07-20 DIAGNOSIS — E876 Hypokalemia: Secondary | ICD-10-CM | POA: Diagnosis not present

## 2019-07-20 DIAGNOSIS — M50222 Other cervical disc displacement at C5-C6 level: Secondary | ICD-10-CM | POA: Insufficient documentation

## 2019-07-20 DIAGNOSIS — R2990 Unspecified symptoms and signs involving the nervous system: Secondary | ICD-10-CM

## 2019-07-20 DIAGNOSIS — E119 Type 2 diabetes mellitus without complications: Secondary | ICD-10-CM | POA: Diagnosis not present

## 2019-07-20 DIAGNOSIS — E785 Hyperlipidemia, unspecified: Secondary | ICD-10-CM | POA: Insufficient documentation

## 2019-07-20 DIAGNOSIS — M47812 Spondylosis without myelopathy or radiculopathy, cervical region: Secondary | ICD-10-CM | POA: Insufficient documentation

## 2019-07-20 DIAGNOSIS — I10 Essential (primary) hypertension: Secondary | ICD-10-CM | POA: Insufficient documentation

## 2019-07-20 DIAGNOSIS — Z20822 Contact with and (suspected) exposure to covid-19: Secondary | ICD-10-CM | POA: Insufficient documentation

## 2019-07-20 DIAGNOSIS — R200 Anesthesia of skin: Secondary | ICD-10-CM | POA: Diagnosis not present

## 2019-07-20 DIAGNOSIS — R202 Paresthesia of skin: Secondary | ICD-10-CM | POA: Diagnosis present

## 2019-07-20 DIAGNOSIS — Z9884 Bariatric surgery status: Secondary | ICD-10-CM | POA: Diagnosis not present

## 2019-07-20 DIAGNOSIS — Z79899 Other long term (current) drug therapy: Secondary | ICD-10-CM | POA: Insufficient documentation

## 2019-07-20 DIAGNOSIS — E538 Deficiency of other specified B group vitamins: Secondary | ICD-10-CM | POA: Diagnosis not present

## 2019-07-20 DIAGNOSIS — K219 Gastro-esophageal reflux disease without esophagitis: Secondary | ICD-10-CM

## 2019-07-20 DIAGNOSIS — G43109 Migraine with aura, not intractable, without status migrainosus: Principal | ICD-10-CM | POA: Insufficient documentation

## 2019-07-20 DIAGNOSIS — G459 Transient cerebral ischemic attack, unspecified: Secondary | ICD-10-CM

## 2019-07-20 LAB — COMPREHENSIVE METABOLIC PANEL
ALT: 21 U/L (ref 0–44)
AST: 19 U/L (ref 15–41)
Albumin: 4.4 g/dL (ref 3.5–5.0)
Alkaline Phosphatase: 95 U/L (ref 38–126)
Anion gap: 8 (ref 5–15)
BUN: 11 mg/dL (ref 6–20)
CO2: 27 mmol/L (ref 22–32)
Calcium: 9.1 mg/dL (ref 8.9–10.3)
Chloride: 103 mmol/L (ref 98–111)
Creatinine, Ser: 0.79 mg/dL (ref 0.44–1.00)
GFR calc Af Amer: >60 mL/min (ref >60)
GFR calc non Af Amer: >60 mL/min (ref >60)
Glucose, Bld: 103 mg/dL — ABNORMAL HIGH (ref 70–99)
Potassium: 4 mmol/L (ref 3.5–5.1)
Sodium: 138 mmol/L (ref 135–145)
Total Bilirubin: 0.3 mg/dL (ref 0.3–1.2)
Total Protein: 8 g/dL (ref 6.5–8.1)

## 2019-07-20 LAB — APTT: aPTT: 30 seconds (ref 24–36)

## 2019-07-20 LAB — CBC
HCT: 40.4 % (ref 36.0–46.0)
Hemoglobin: 12.9 g/dL (ref 12.0–15.0)
MCH: 29.8 pg (ref 26.0–34.0)
MCHC: 31.9 g/dL (ref 30.0–36.0)
MCV: 93.3 fL (ref 80.0–100.0)
Platelets: 289 10*3/uL (ref 150–400)
RBC: 4.33 MIL/uL (ref 3.87–5.11)
RDW: 12.6 % (ref 11.5–15.5)
WBC: 6.5 10*3/uL (ref 4.0–10.5)
nRBC: 0 % (ref 0.0–0.2)

## 2019-07-20 LAB — DIFFERENTIAL
Abs Immature Granulocytes: 0.01 10*3/uL (ref 0.00–0.07)
Basophils Absolute: 0 10*3/uL (ref 0.0–0.1)
Basophils Relative: 1 %
Eosinophils Absolute: 0.1 10*3/uL (ref 0.0–0.5)
Eosinophils Relative: 2 %
Immature Granulocytes: 0 %
Lymphocytes Relative: 30 %
Lymphs Abs: 1.9 10*3/uL (ref 0.7–4.0)
Monocytes Absolute: 0.5 10*3/uL (ref 0.1–1.0)
Monocytes Relative: 7 %
Neutro Abs: 4 10*3/uL (ref 1.7–7.7)
Neutrophils Relative %: 60 %

## 2019-07-20 LAB — PROTIME-INR
INR: 0.9 (ref 0.8–1.2)
Prothrombin Time: 12.1 seconds (ref 11.4–15.2)

## 2019-07-20 LAB — GLUCOSE, CAPILLARY: Glucose-Capillary: 91 mg/dL (ref 70–99)

## 2019-07-20 IMAGING — CR DG CHEST 2V
2 series · 2 of 2 positions shown · non-contrast
Comparison: [DATE]

CLINICAL DATA: Facial numbness

EXAM:
CHEST - 2 VIEW

[chest pa]
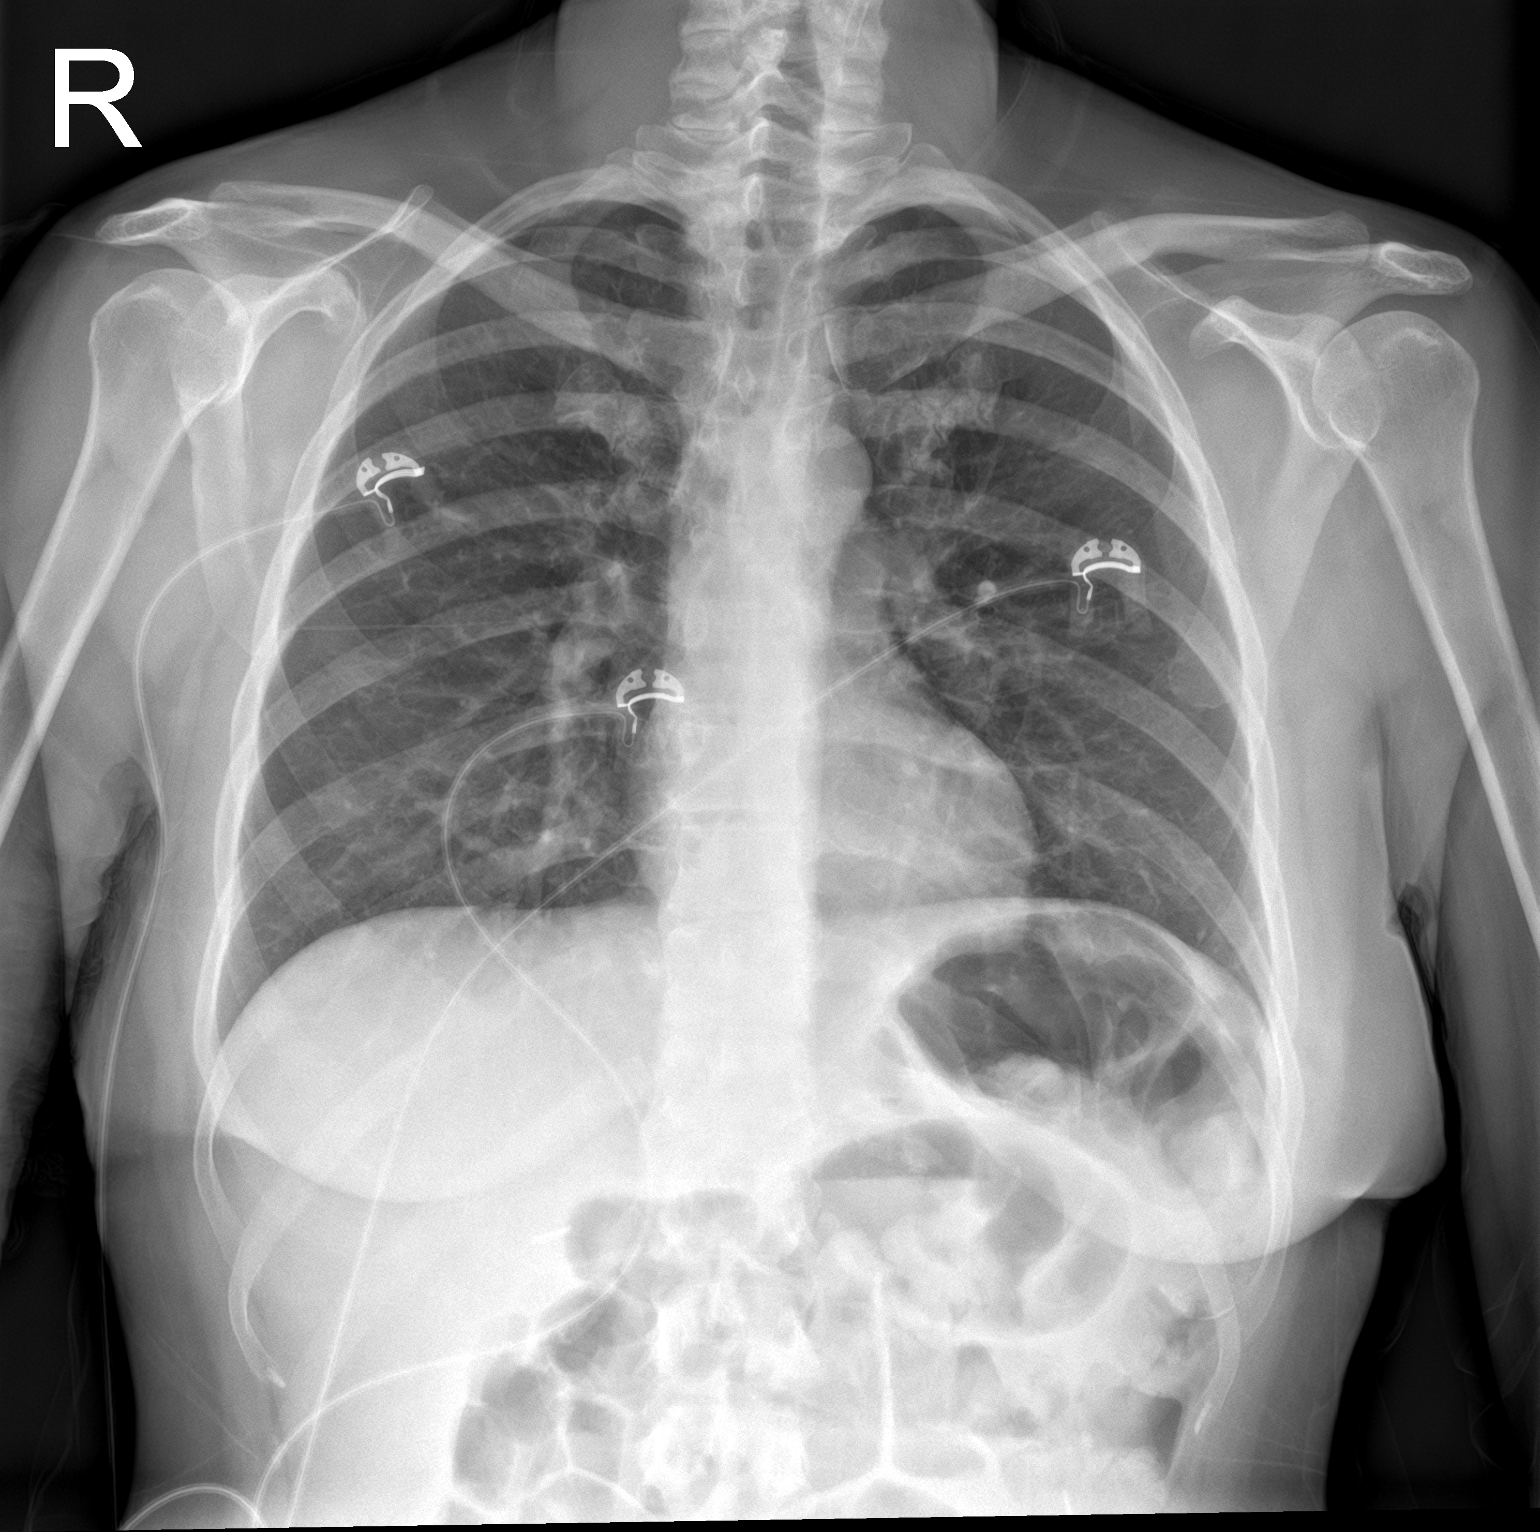

[chest lat]
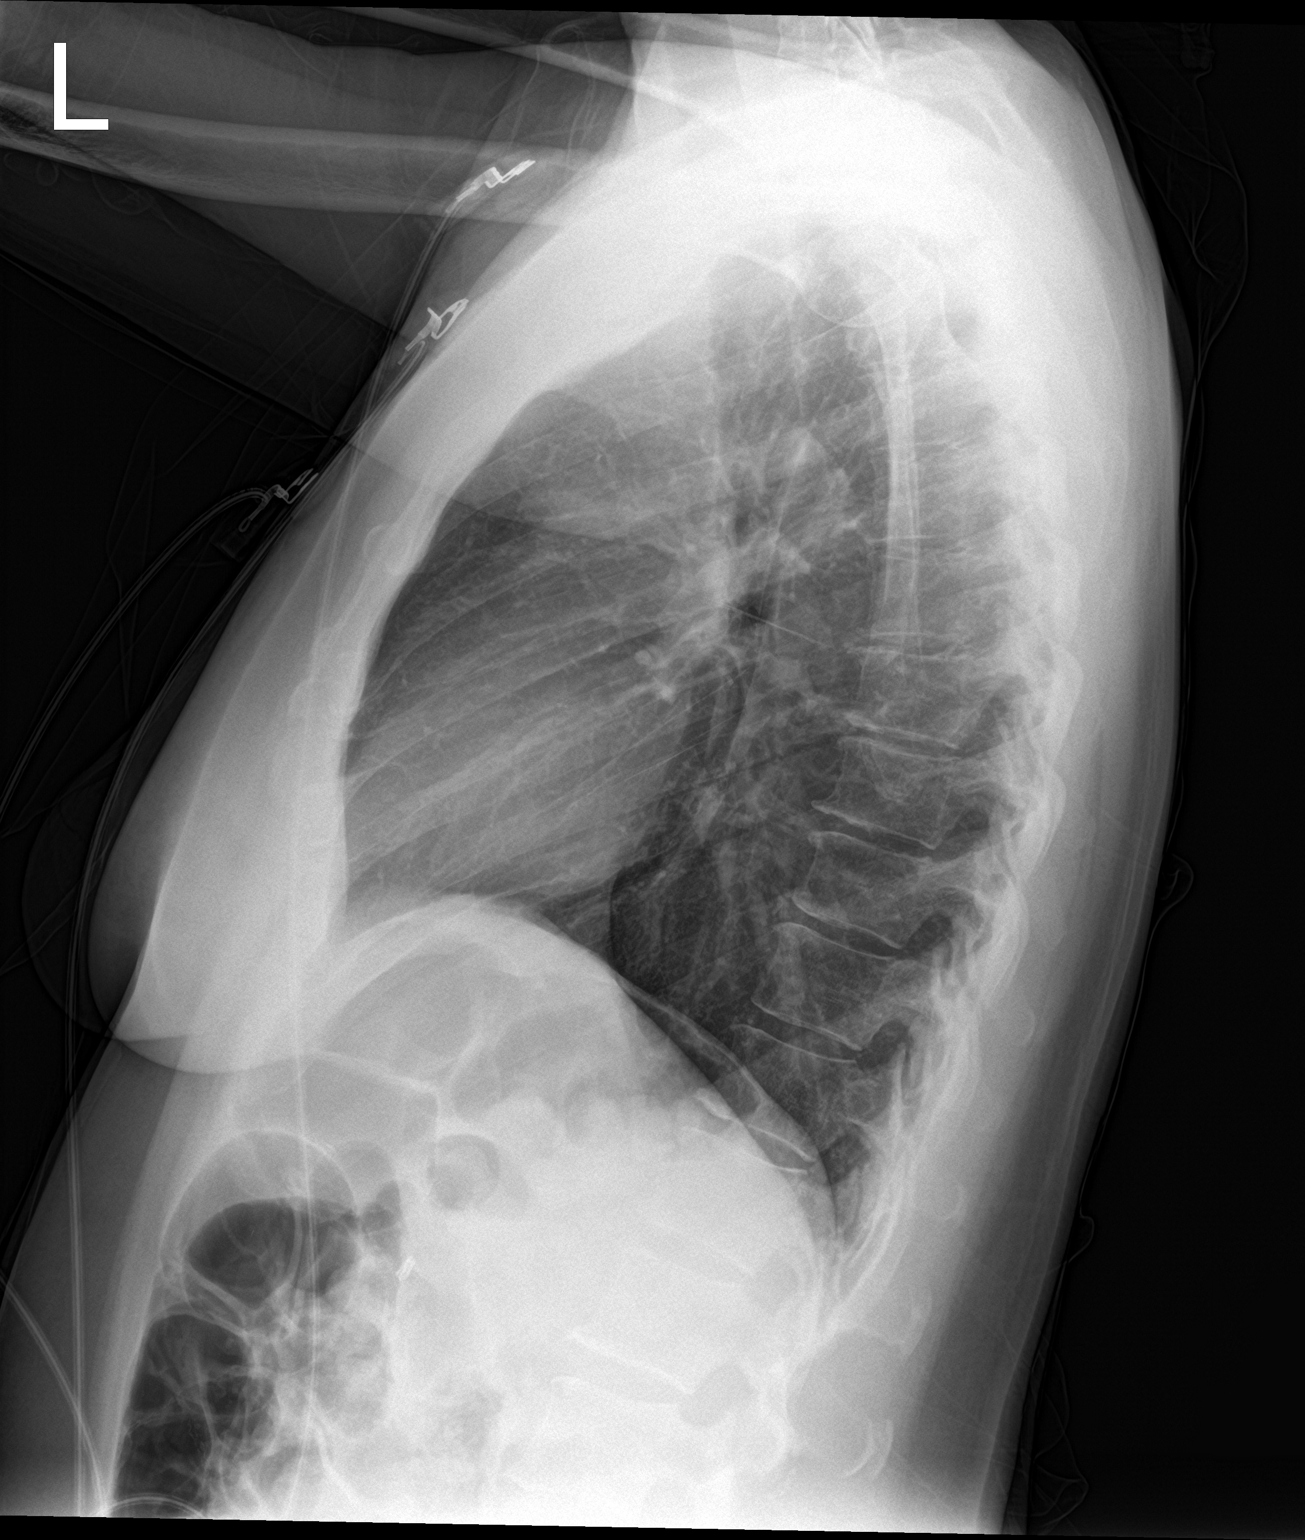

[2 of 2 positions shown; findings below may reference images not displayed]

FINDINGS: The heart size and mediastinal contours are within normal limits.
Both lungs are clear. The visualized skeletal structures are
unremarkable.
IMPRESSION: No active cardiopulmonary disease.

## 2019-07-20 IMAGING — MR MR CERVICAL SPINE W/O CM
5 series · 40 of 48 positions shown · non-contrast
Comparison: None.

CLINICAL DATA: Left face and arm numbness.

EXAM:
MRI CERVICAL SPINE WITHOUT CONTRAST
TECHNIQUE: Multiplanar, multisequence MR imaging of the cervical spine was
performed. No intravenous contrast was administered.

[Series 5: T2 · sagittal · 3.0mm · 0.62mm/px · 7 of 15 slices shown (1 of 2)]
[im 1/15]
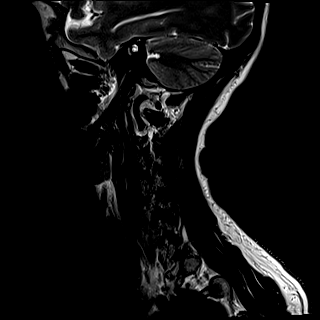
[im 3/15]
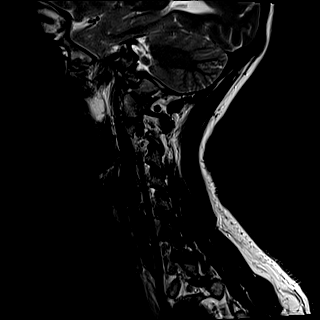
[im 5/15]
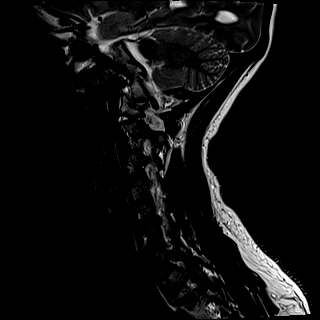
[im 8/15]
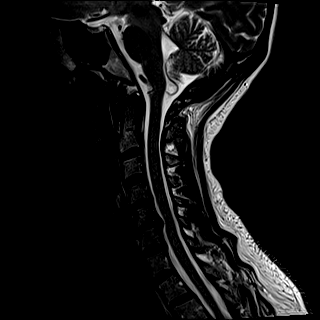
[im 10/15]
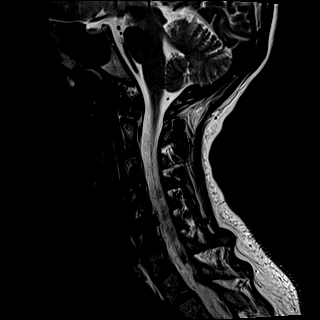
[im 12/15]
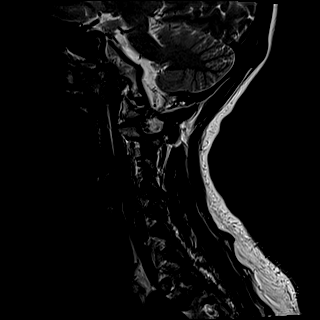
[im 15/15]
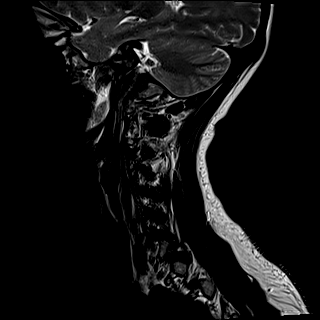

[Series 6: FLAIR · sagittal · 3.0mm · 0.78mm/px · 7 of 15 slices shown]
[im 1/15]
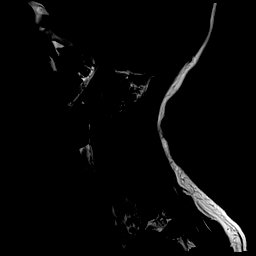
[im 3/15]
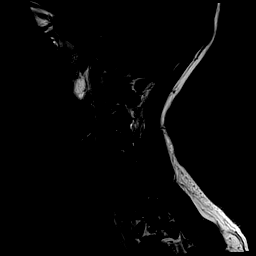
[im 5/15]
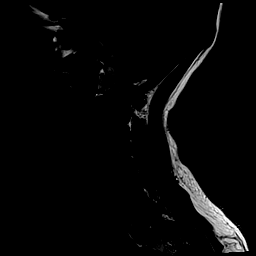
[im 8/15]
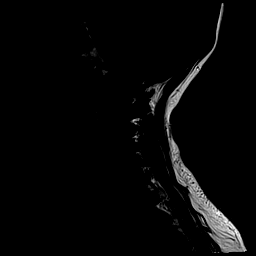
[im 10/15]
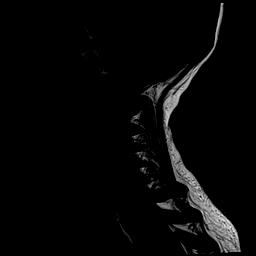
[im 12/15]
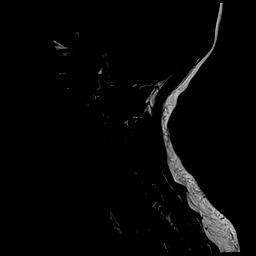
[im 15/15]
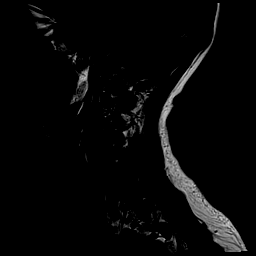

[Series 7: STIR · sagittal · 3.0mm · 0.62mm/px · 7 of 15 slices shown]
[im 1/15]
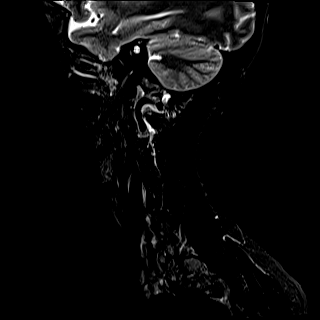
[im 3/15]
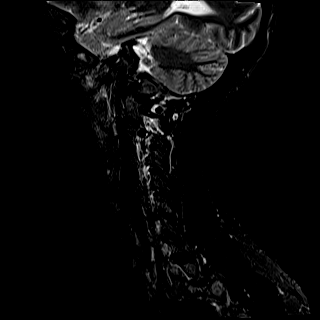
[im 5/15]
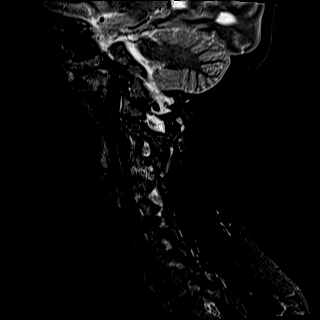
[im 8/15]
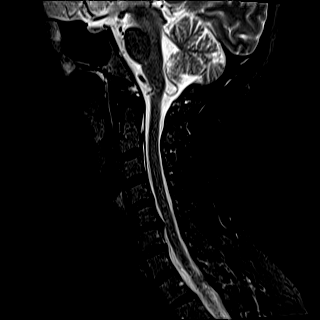
[im 10/15]
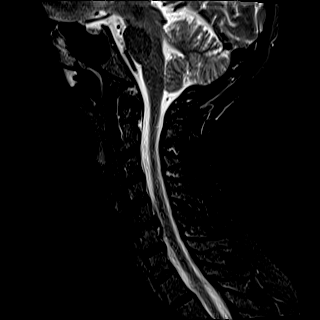
[im 12/15]
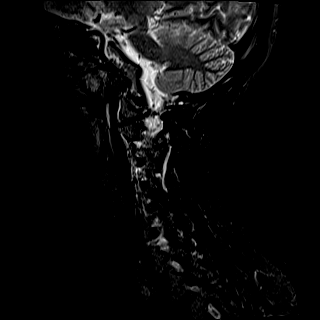
[im 15/15]
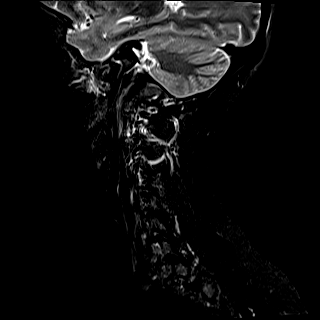

[Series 8: T2 · axial · 3.0mm · 0.70mm/px · z∈[-237,-146]mm · 11 of 27 slices shown (2 of 2)]
[im 1/27]
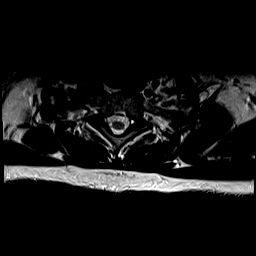
[im 3/27]
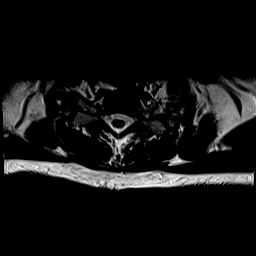
[im 5/27]
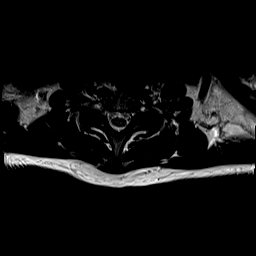
[im 7/27]
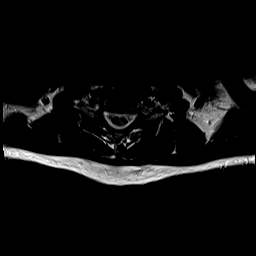
[im 9/27]
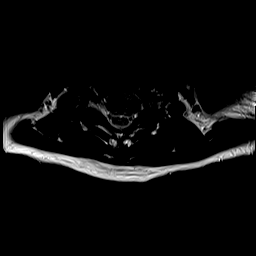
[im 11/27]
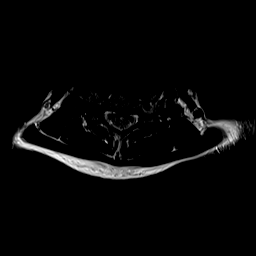
[im 14/27]
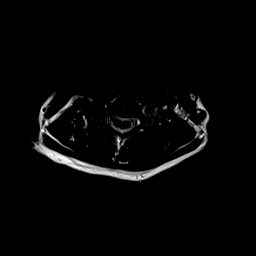
[im 16/27]
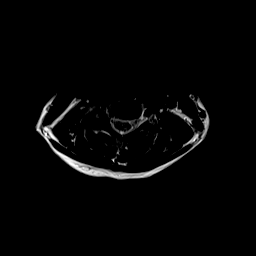
[im 18/27]
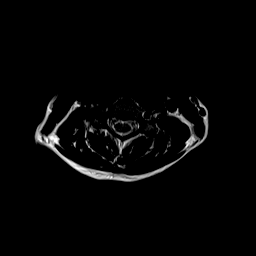
[im 22/27]
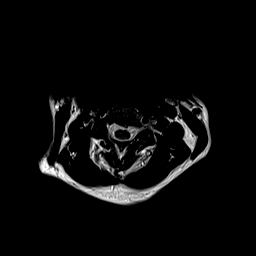
[im 27/27]
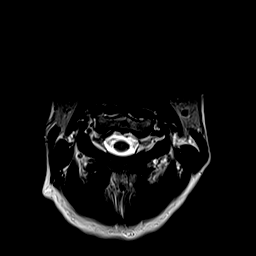

[Series 9: ax mpgr · axial · 3.0mm · 0.35mm/px · z∈[-237,-146]mm · 8 of 29 slices shown]
[im 1/29]
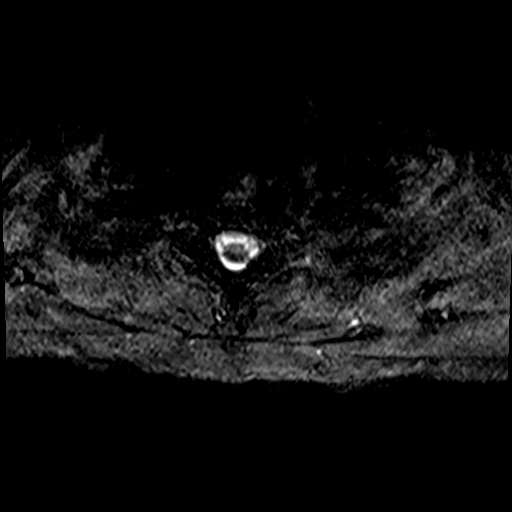
[im 5/29]
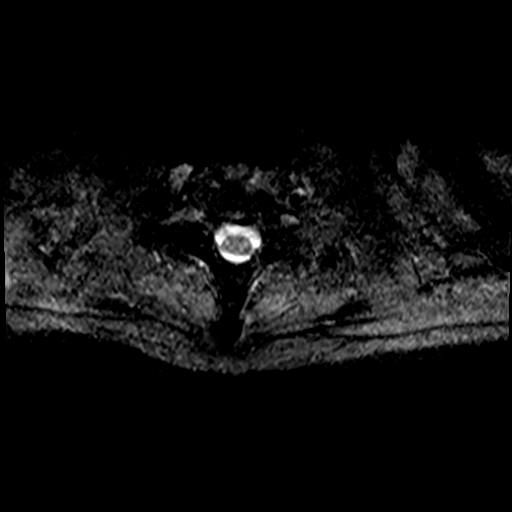
[im 9/29]
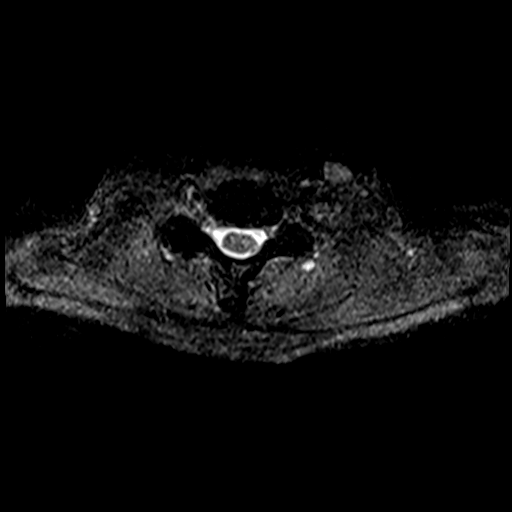
[im 13/29]
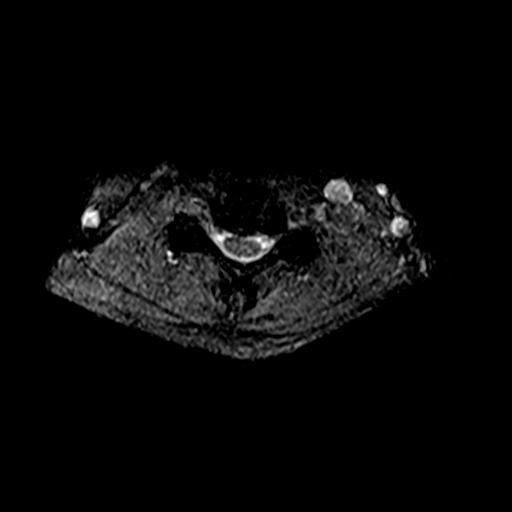
[im 16/29]
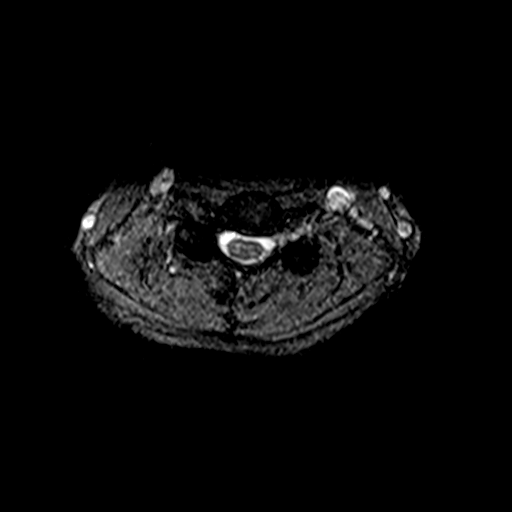
[im 20/29]
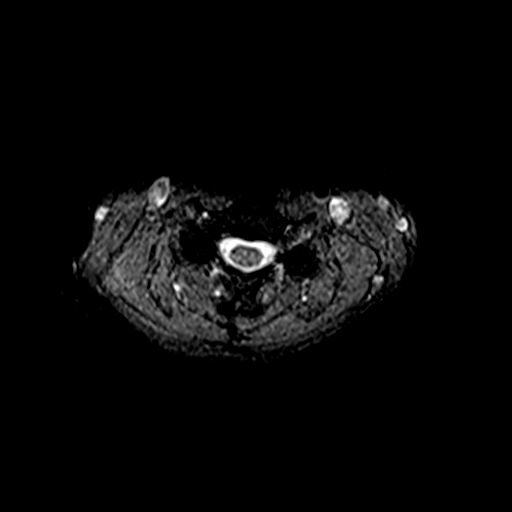
[im 24/29]
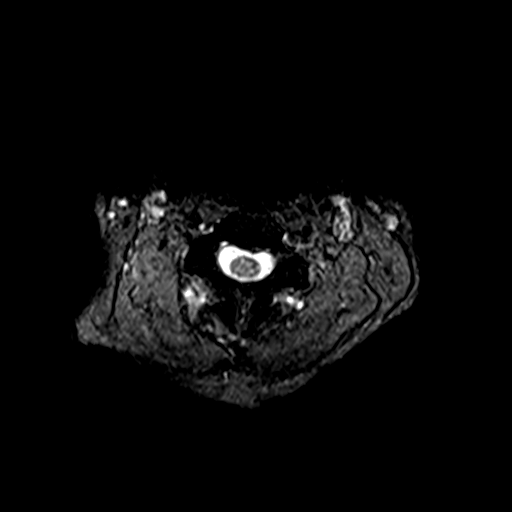
[im 29/29]
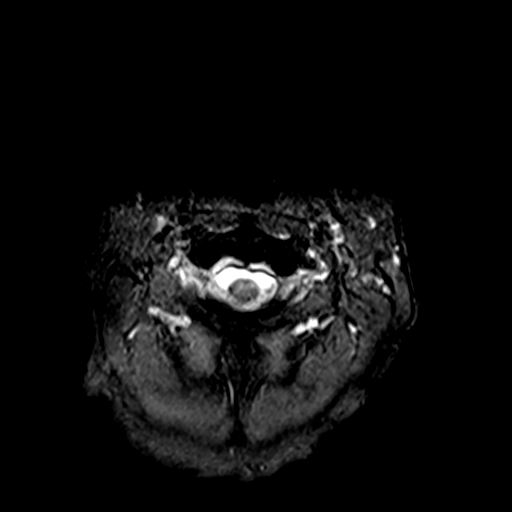

[40 of 48 positions shown; findings below may reference images not displayed]

FINDINGS: Alignment: Normal

Vertebrae: Normal

Cord: Normal

Posterior Fossa, vertebral arteries, paraspinal tissues: Normal

Disc levels:

No abnormality from the foramen magnum through C4-5. Canal and
foramina widely patent.

C5-6: Spondylosis with endplate osteophytes and moderate disc
protrusion. Canal narrowing with AP diameter in the midline 8 mm.
Narrowing of the subarachnoid space but no frank compression of the
cord. Mild foraminal narrowing on the right and moderate foraminal
narrowing on the left.

C6-7: Mild disc bulge.  No canal or foraminal stenosis.

C7-T1: Normal interspace.
IMPRESSION: No cord abnormality.

C5-6: Spondylosis with endplate osteophytes and moderate disc
protrusion. Narrowing of the ventral subarachnoid space but no
compression of the cord. AP diameter of the canal 8 mm. Mild
foraminal narrowing on the right and moderate foraminal narrowing on
the left. Some possibility that this could in particular affect the
left C6 nerve.

C6-7: Mild noncompressive disc bulge.

## 2019-07-20 IMAGING — MR MR HEAD W/O CM
11 series · 43 of 48 positions shown · non-contrast
Comparison: Head CT same day

CLINICAL DATA: Left facial numbness and left arm numbness.

EXAM:
MRI HEAD WITHOUT CONTRAST
TECHNIQUE: Multiplanar, multiecho pulse sequences of the brain and surrounding
structures were obtained without intravenous contrast.

[Series 5: ax dwi_tracew · axial · 3.0mm · 0.60mm/px · z∈[-95,+53]mm · 5 of 46 slices shown]
[im 1/46]
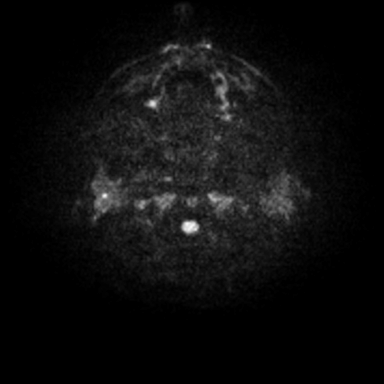
[im 12/46]
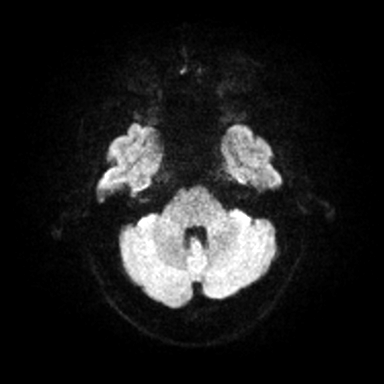
[im 23/46]
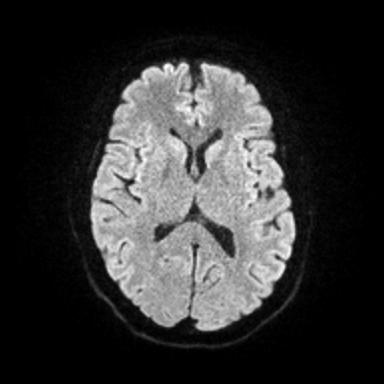
[im 34/46]
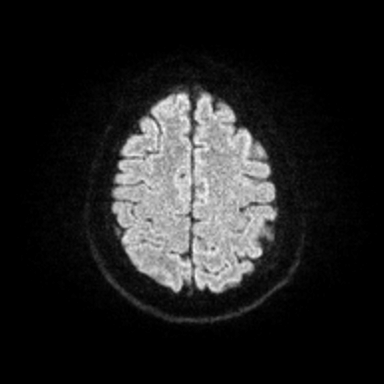
[im 46/46]
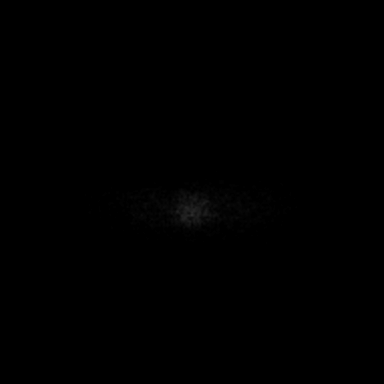

[Series 6: ax dwi_adc · axial · 3.0mm · 0.60mm/px · z∈[-95,+53]mm · 4 of 46 slices shown]
[im 1/46]
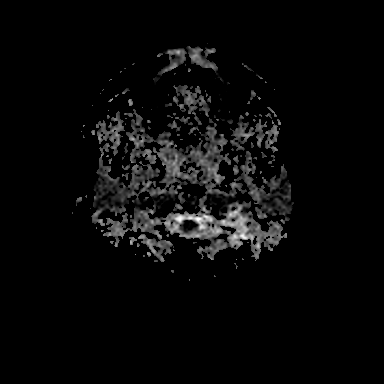
[im 16/46]
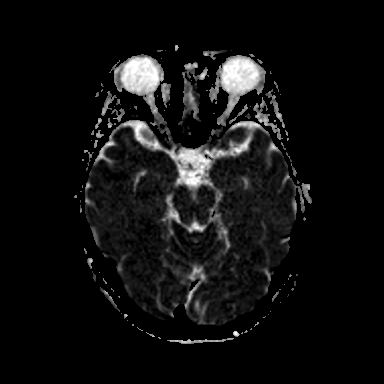
[im 31/46]
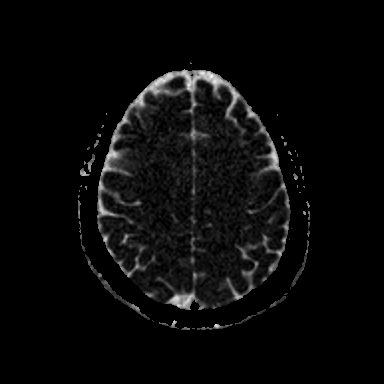
[im 46/46]
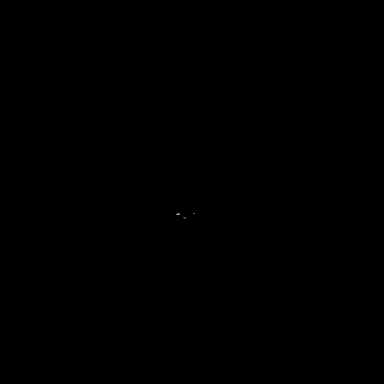

[Series 7: cor dwi_tracew · coronal · 5.0mm · 0.60mm/px · 3 of 36 slices shown]
[im 1/36]
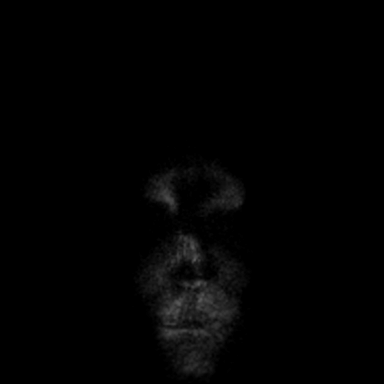
[im 18/36]
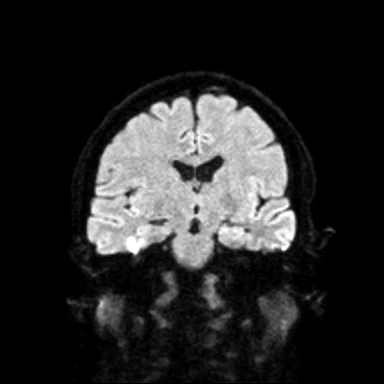
[im 36/36]
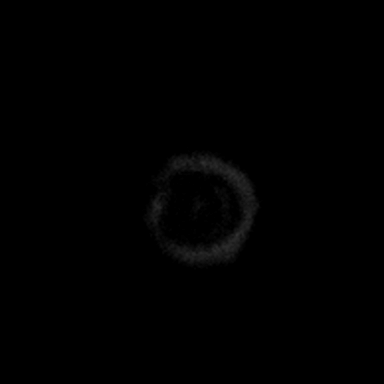

[Series 8: cor dwi_adc · coronal · 5.0mm · 0.60mm/px · 3 of 36 slices shown]
[im 1/36]
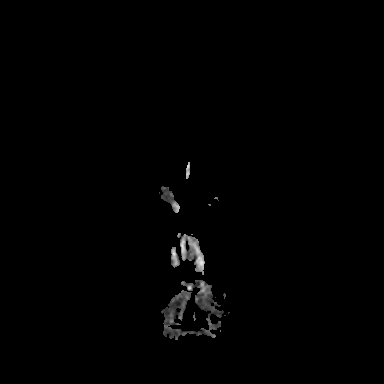
[im 18/36]
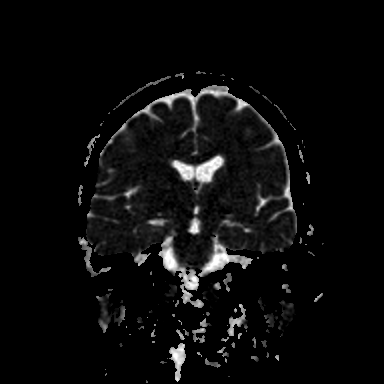
[im 36/36]
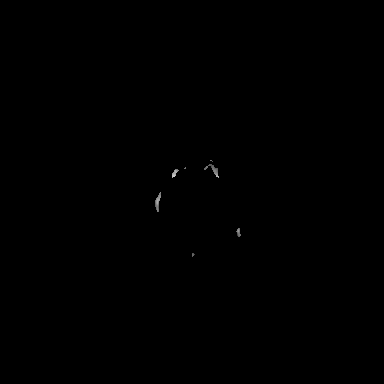

[Series 9: T1 · sagittal · 5.0mm · 0.62mm/px · 2 of 21 slices shown (1 of 2)]
[im 1/21]
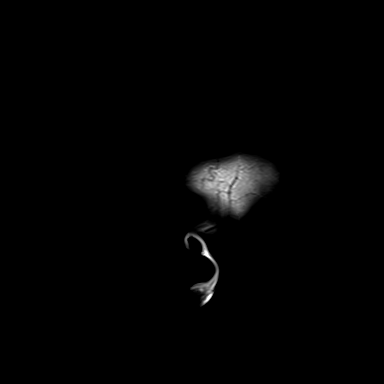
[im 21/21]
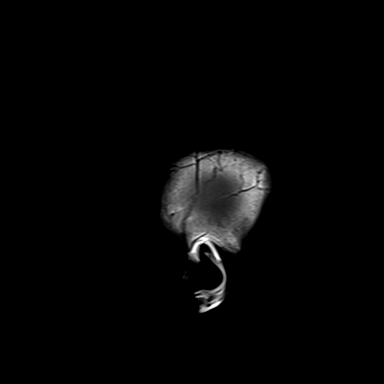

[Series 10: T2 · axial · 5.0mm · 0.53mm/px · z∈[-93,+50]mm · 2 of 25 slices shown (1 of 2)]
[im 1/25]
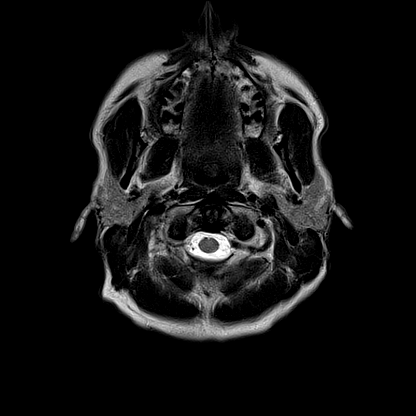
[im 25/25]
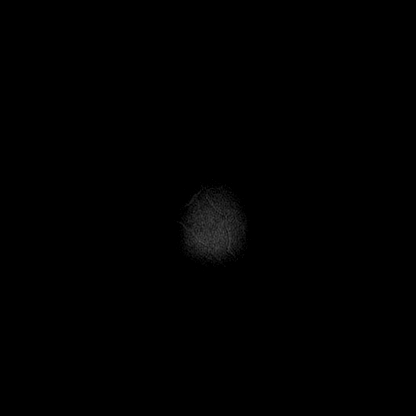

[Series 12: pha_images · axial · 3.0mm · 0.90mm/px · z∈[-109,+67]mm · 5 of 59 slices shown]
[im 1/59]
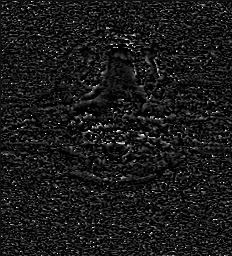
[im 15/59]
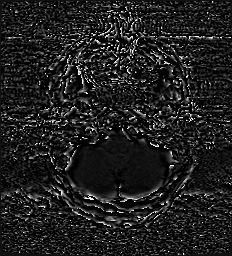
[im 30/59]
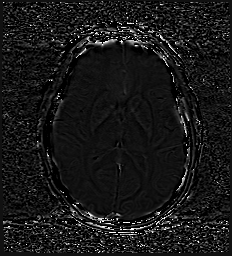
[im 44/59]
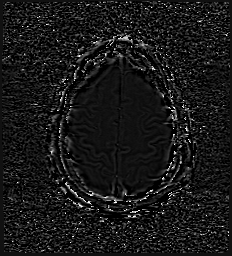
[im 59/59]
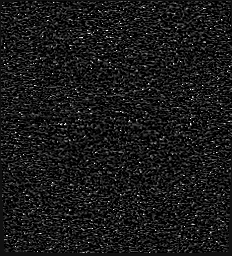

[Series 13: swi_images · axial · 3.0mm · 0.90mm/px · z∈[-109,+22]mm · 4 of 60 slices shown]
[im 1/60]
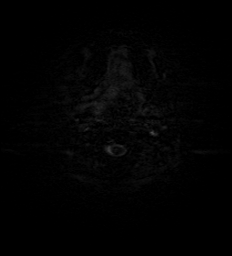
[im 15/60]
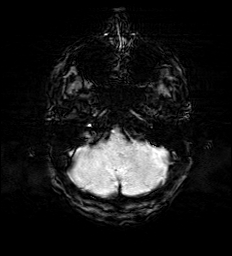
[im 30/60]
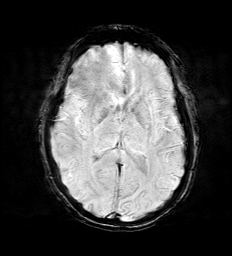
[im 45/60]
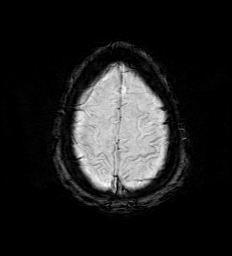

[Series 15: FLAIR · axial · 3.0mm · 0.53mm/px · z∈[-102,+59]mm · 5 of 55 slices shown]
[im 1/55]
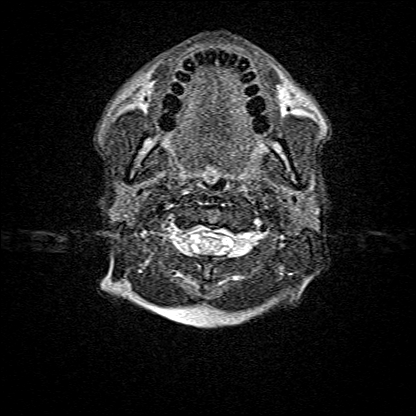
[im 14/55]
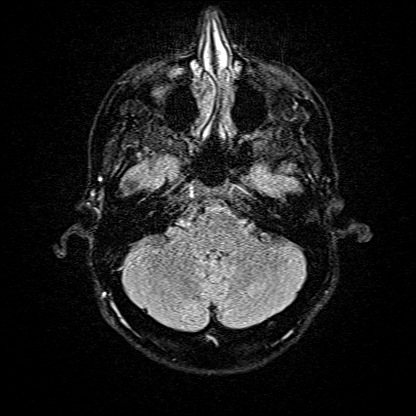
[im 28/55]
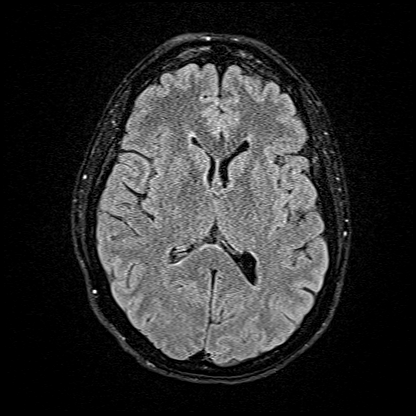
[im 41/55]
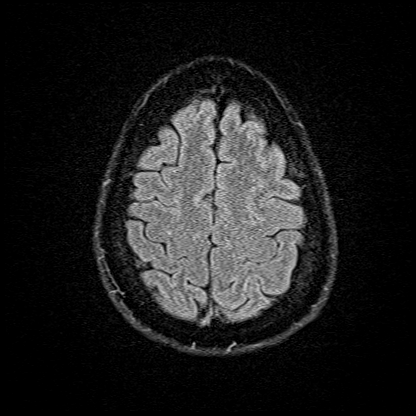
[im 55/55]
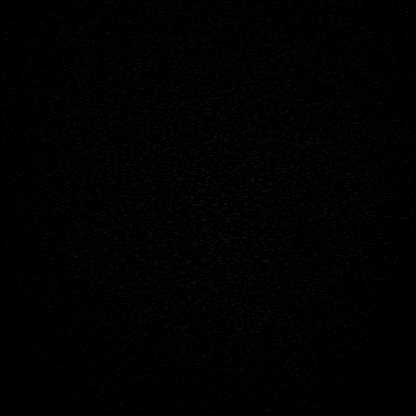

[Series 16: T1 · axial · 1.0mm · 0.98mm/px · z∈[-91,+51]mm · 8 of 144 slices shown (2 of 2)]
[im 1/144]
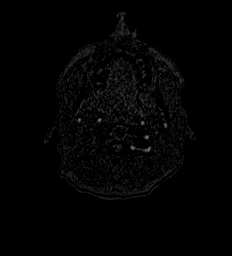
[im 27/144]
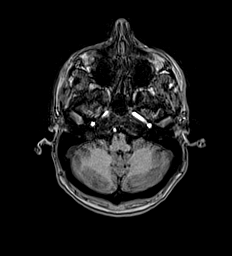
[im 40/144]
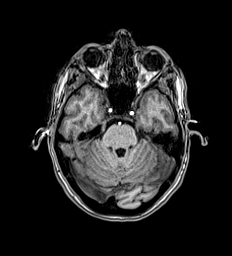
[im 66/144]
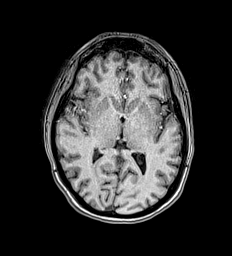
[im 79/144]
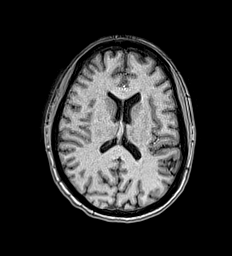
[im 105/144]
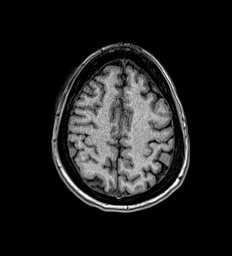
[im 118/144]
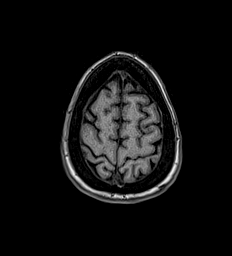
[im 144/144]
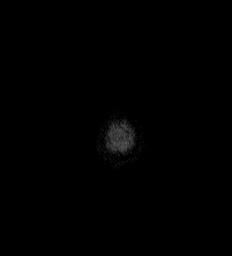

[Series 17: T2 · coronal · 5.0mm · 0.57mm/px · 2 of 27 slices shown (2 of 2)]
[im 1/27]
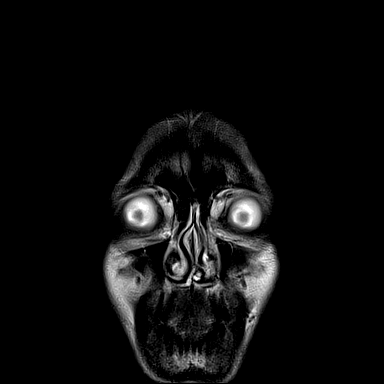
[im 27/27]
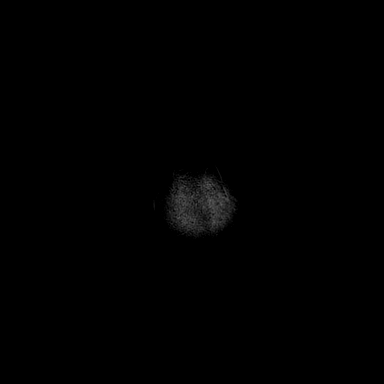

[43 of 48 positions shown; findings below may reference images not displayed]

FINDINGS: Brain: The brain has a normal appearance without evidence of
malformation, atrophy, old or acute small or large vessel
infarction, mass lesion, hemorrhage, hydrocephalus or extra-axial
collection.

Vascular: Major vessels at the base of the brain show flow. Venous
sinuses appear patent.

Skull and upper cervical spine: Normal.

Sinuses/Orbits: Clear/normal.

Other: None significant.
IMPRESSION: Normal brain MRI

## 2019-07-20 IMAGING — CT CT HEAD CODE STROKE
3 series · 16 of 44 positions shown, 19 images · non-contrast
Comparison: None.

CLINICAL DATA: Code stroke. Left facial numbness beginning [VC]
hours.

EXAM:
CT HEAD WITHOUT CONTRAST
TECHNIQUE: Contiguous axial images were obtained from the base of the skull
through the vertex without intravenous contrast.

[Series 3: head wo · axial · 0.39mm/px · z∈[+1116,+1226]mm · 10 of 27 slices shown, 13 images]
[im 3/27  brain]
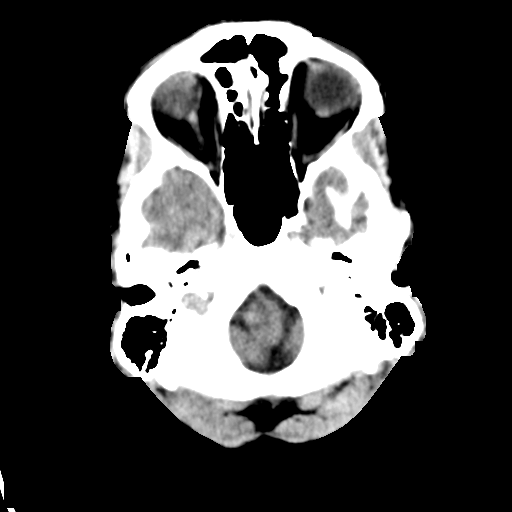
[im 3/27  bone]
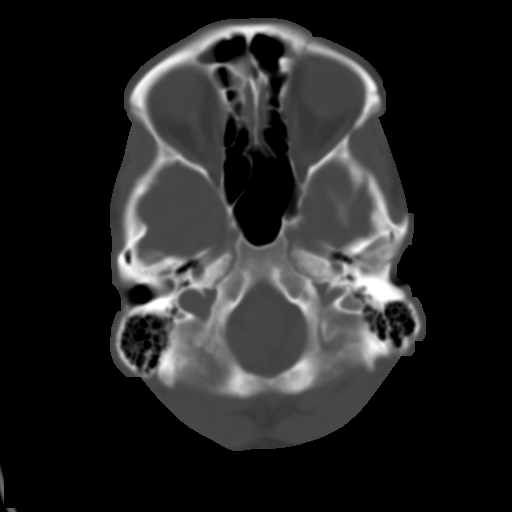
[im 5/27  brain]
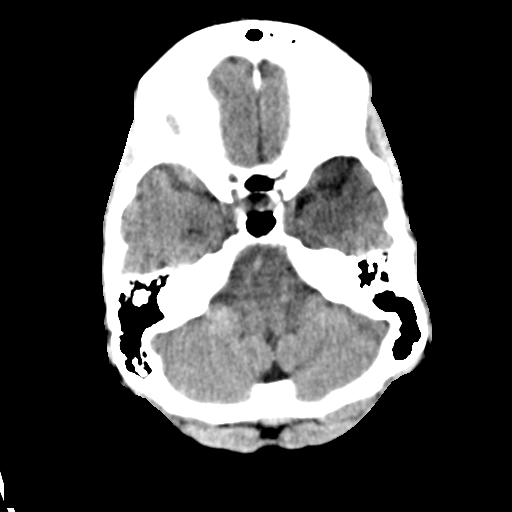
[im 8/27  brain]
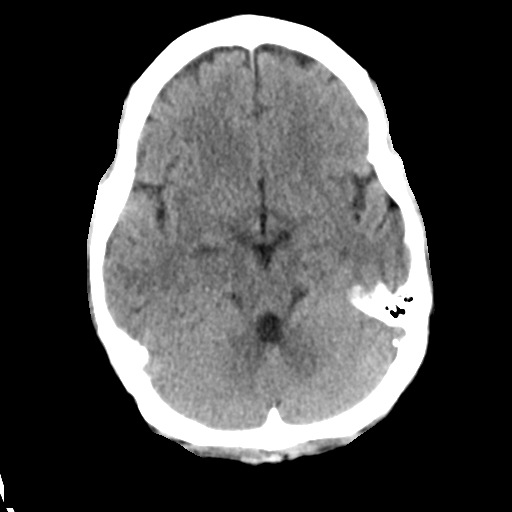
[im 10/27  brain]
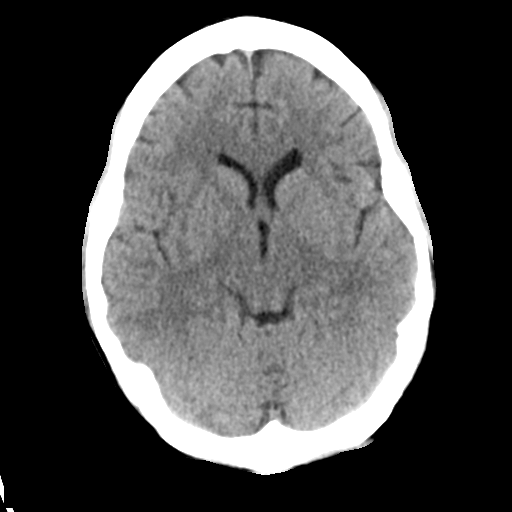
[im 13/27  brain]
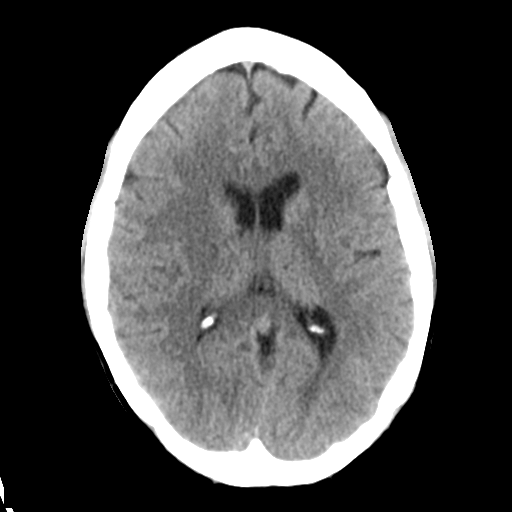
[im 13/27  bone]
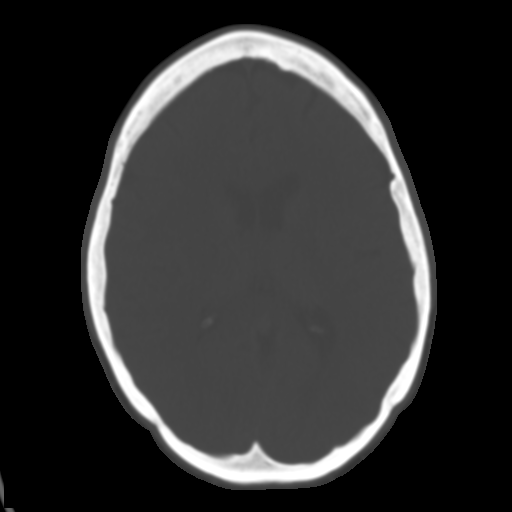
[im 15/27  brain]
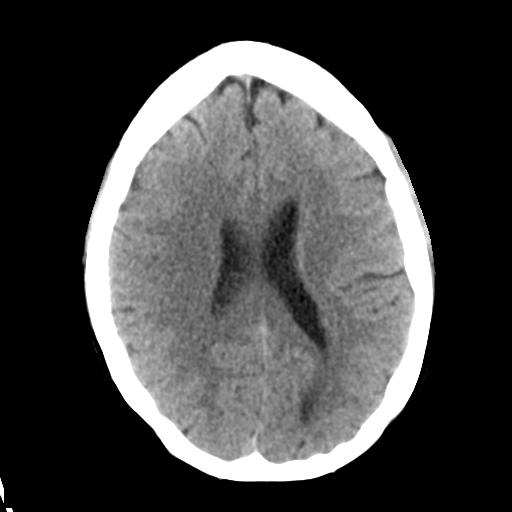
[im 18/27  brain]
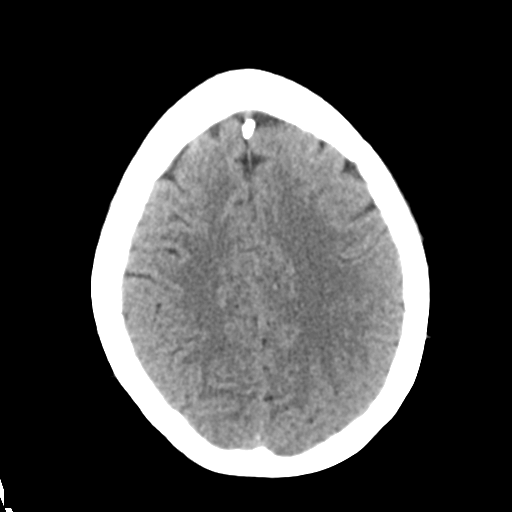
[im 20/27  brain]
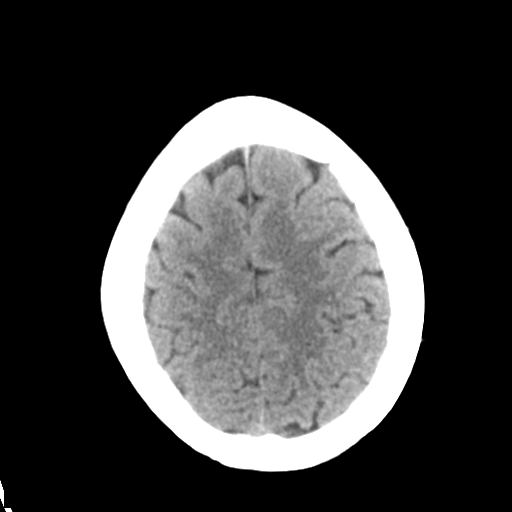
[im 23/27  brain]
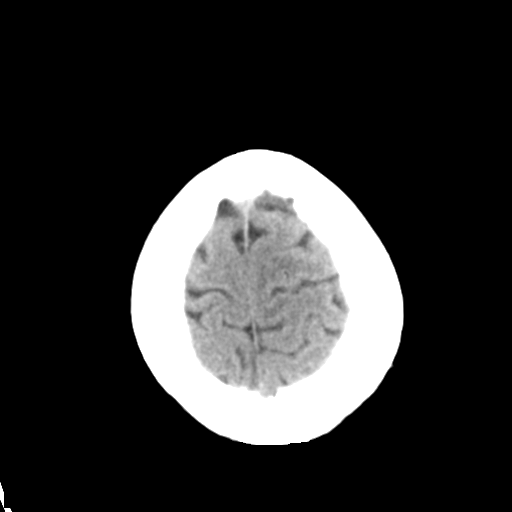
[im 23/27  bone]
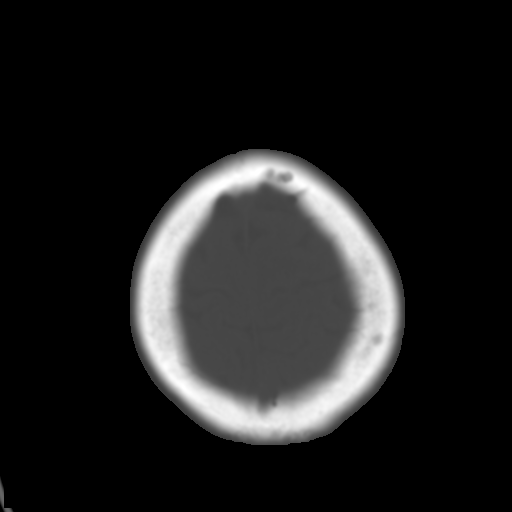
[im 25/27  brain]
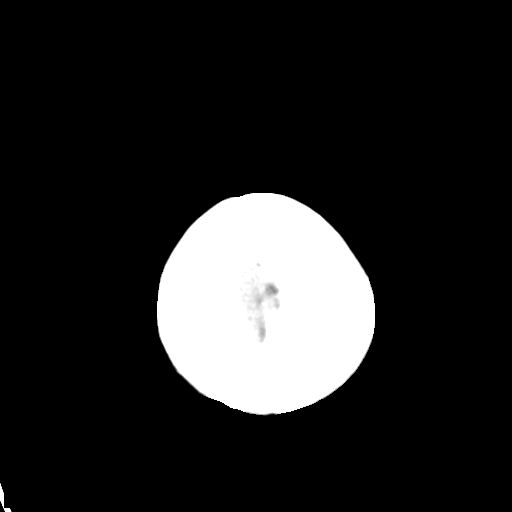

[Series 4: coronal soft tissue · coronal · 0.30mm/px · 3 of 67 slices shown]
[im 23/67  brain]
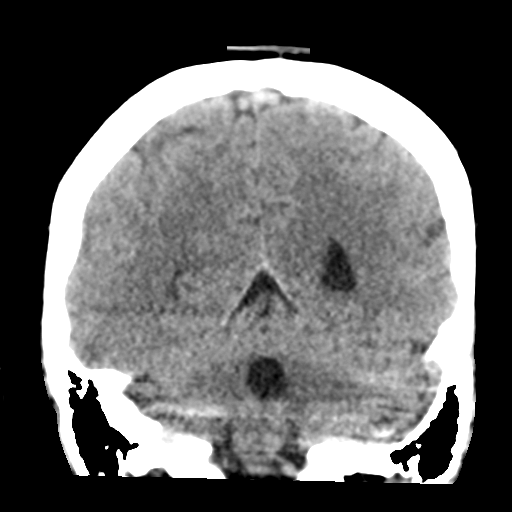
[im 30/67  brain]
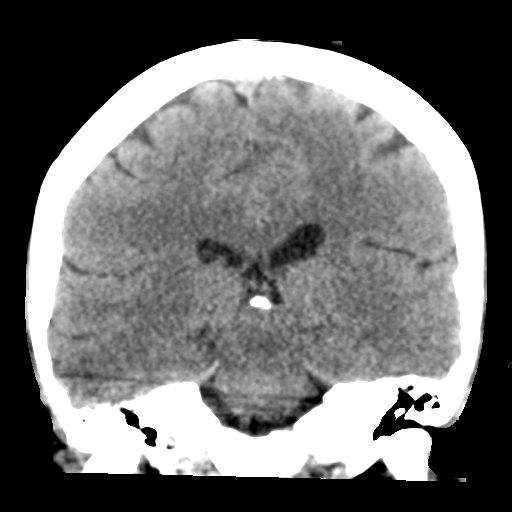
[im 37/67  brain]
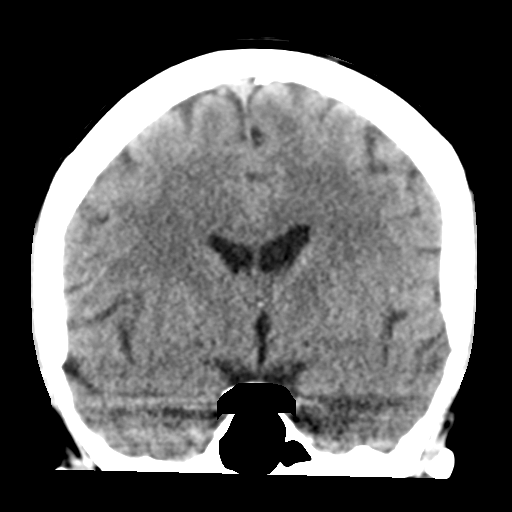

[Series 5: sagittal soft tissue · sagittal · 0.30mm/px · 3 of 49 slices shown]
[im 17/49  brain]
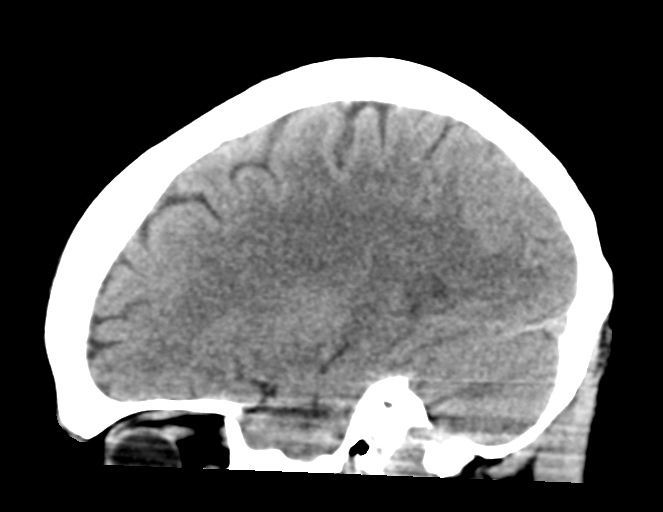
[im 25/49  brain]
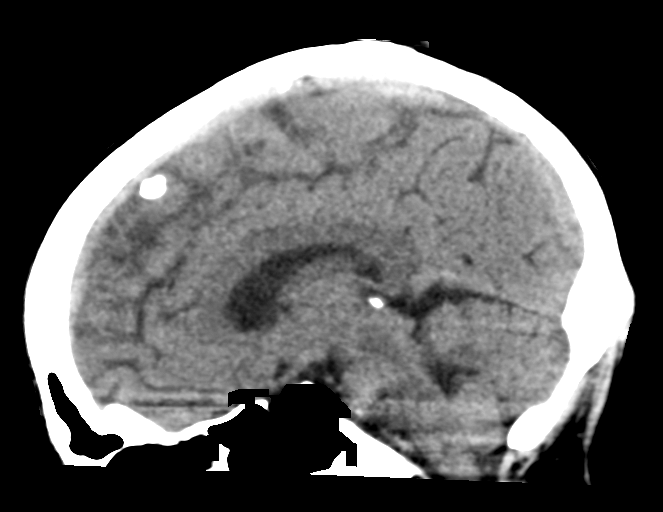
[im 33/49  brain]
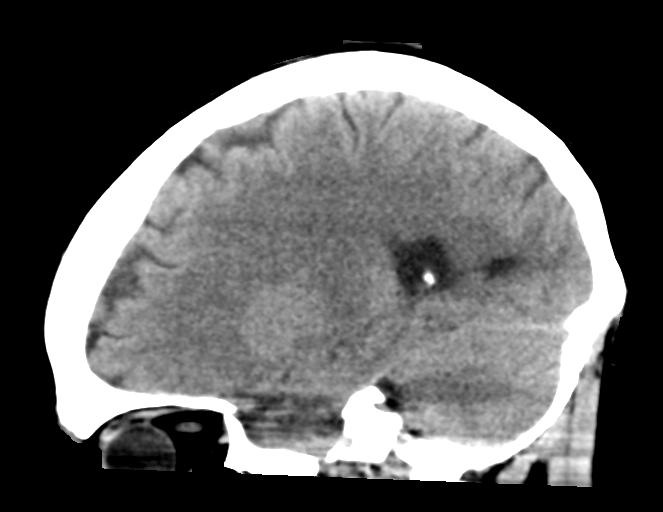

[16 of 44 positions shown; findings below may reference images not displayed]

FINDINGS: Brain: Normal appearance of the brain without evidence of old or
acute infarction, mass lesion, hemorrhage, hydrocephalus or
extra-axial collection.

Vascular: No abnormal vascular finding.

Skull: Normal

Sinuses/Orbits: Clear/normal

Other: None

ASPECTS (Alberta Stroke Program Early CT Score)

- Ganglionic level infarction (caudate, lentiform nuclei, internal
capsule, insula, M1-M3 cortex): 7

- Supraganglionic infarction (M4-M6 cortex): 3

Total score (0-10 with 10 being normal): 10
IMPRESSION: 1. Normal head CT
2. ASPECTS is 10.
3. Call report in progress at [VC] hours.  Currently no answer.

## 2019-07-20 MED ORDER — SODIUM CHLORIDE 0.9 % IV SOLN: INTRAVENOUS | Status: DC

## 2019-07-20 MED ORDER — CLOPIDOGREL BISULFATE 75 MG PO TABS
75.0000 mg | ORAL_TABLET | Freq: Every day | ORAL | Status: DC
  Administered 2019-07-20: 75 mg via ORAL
  Filled 2019-07-20 (×2): qty 1

## 2019-07-20 MED ORDER — SODIUM CHLORIDE 0.9% FLUSH
3.0000 mL | Freq: Once | INTRAVENOUS | Status: AC
  Administered 2019-07-20: 3 mL via INTRAVENOUS

## 2019-07-20 MED ORDER — ACETAMINOPHEN 650 MG RE SUPP: 650.0000 mg | RECTAL | Status: DC | PRN

## 2019-07-20 MED ORDER — PANTOPRAZOLE SODIUM 40 MG PO TBEC
40.0000 mg | DELAYED_RELEASE_TABLET | Freq: Every day | ORAL | Status: DC
  Administered 2019-07-20 – 2019-07-21 (×2): 40 mg via ORAL
  Filled 2019-07-20 (×2): qty 1

## 2019-07-20 MED ORDER — SENNOSIDES-DOCUSATE SODIUM 8.6-50 MG PO TABS: 1.0000 | ORAL_TABLET | Freq: Every evening | ORAL | Status: DC | PRN

## 2019-07-20 MED ORDER — SODIUM CHLORIDE 0.9 % IV SOLN: Freq: Once | INTRAVENOUS | Status: AC

## 2019-07-20 MED ORDER — ENOXAPARIN SODIUM 40 MG/0.4ML ~~LOC~~ SOLN
40.0000 mg | SUBCUTANEOUS | Status: DC
  Administered 2019-07-20: 40 mg via SUBCUTANEOUS
  Filled 2019-07-20: qty 0.4

## 2019-07-20 MED ORDER — ACETAMINOPHEN 160 MG/5ML PO SOLN
650.0000 mg | ORAL | Status: DC | PRN
  Filled 2019-07-20: qty 20.3

## 2019-07-20 MED ORDER — LORAZEPAM 2 MG/ML IJ SOLN
1.0000 mg | Freq: Once | INTRAMUSCULAR | Status: AC
  Administered 2019-07-20: 1 mg via INTRAVENOUS
  Filled 2019-07-20: qty 1

## 2019-07-20 MED ORDER — SUCRALFATE 1 GM/10ML PO SUSP
1.0000 g | Freq: Three times a day (TID) | ORAL | Status: DC
  Administered 2019-07-20 – 2019-07-21 (×2): 1 g via ORAL
  Filled 2019-07-20 (×5): qty 10

## 2019-07-20 MED ORDER — STROKE: EARLY STAGES OF RECOVERY BOOK: Freq: Once | Status: DC

## 2019-07-20 MED ORDER — ACARBOSE 50 MG PO TABS
50.0000 mg | ORAL_TABLET | Freq: Three times a day (TID) | ORAL | Status: DC
  Filled 2019-07-20 (×2): qty 1

## 2019-07-20 MED ORDER — ACETAMINOPHEN 325 MG PO TABS: 650.0000 mg | ORAL_TABLET | ORAL | Status: DC | PRN

## 2019-07-20 MED ORDER — BUTALBITAL-APAP-CAFFEINE 50-325-40 MG PO TABS
1.0000 | ORAL_TABLET | ORAL | Status: DC | PRN
  Administered 2019-07-20: 1 via ORAL
  Filled 2019-07-20: qty 1

## 2019-07-20 MED ORDER — MORPHINE SULFATE (CONCENTRATE) 10 MG/0.5ML PO SOLN
2.5000 mg | ORAL | Status: DC | PRN
  Administered 2019-07-21: 2.6 mg via ORAL
  Filled 2019-07-20: qty 0.5

## 2019-07-20 NOTE — Consult Note (Signed)
[facility] Consult Services   Date of Service:   07/20/2019 17:35:58  Impression:     .  I63.9 - Cerebrovascular accident (CVA), unspecified mechanism (HCC)  Comments/Sign-Out: 49 y/o with h/o chronic pain, multiple gastric surgeries who woke up on Thursday (1/14) with left arm and body numbness. Numbness now involves her face today.  Metrics: Last Known Well: 07/18/2019 01:30:00 [facility] Notification Time: 07/20/2019 17:35:58 Arrival Time: 07/20/2019 17:18:00 Stamp Time: 07/20/2019 17:35:58 Time First Login Attempt: 07/20/2019 17:41:04 Symptoms: numbness NIHSS Start Assessment Time: 07/20/2019 17:41:27 Patient is not a candidate for Alteplase/Activase. Patient was not deemed candidate for Alteplase/Activase thrombolytics because of Last Well Known Above 4.5 Hours.  CT head was reviewed.  Clinical Presentation is not Suggestive of Large Vessel Occlusive Disease and symptoms > 24 hours  ED Physician notified of diagnostic impression and management plan on 07/20/2019 17:53:52  Our recommendations are outlined below.  Recommendations:     .  Activate Stroke Protocol Admission/Order Set     .  Stroke/Telemetry Floor     .  Neuro Checks     .  Bedside Swallow Eval     .  DVT Prophylaxis     .  IV Fluids, Normal Saline     .  Head of Bed 30 Degrees     .  Euglycemia and Avoid Hyperthermia (PRN Acetaminophen)     .  ASA if no contraindication     .  Covid testing     ------------------------------------------------------------------------------  History of Present Illness: Patient is a 49 year old Female.  Patient was brought by private transportation with symptoms of numbness  49 y/o with h/o chronic pain, multiple gastric surgeries who woke up on Thursday (1/14) with left arm and body numbness. Numbness now involves her face today. Last known normal on 1/14 0130 when she went to bed. Emergent telestroke consult requested. CT  brain reviewed and case discussed with ED attending. NIHSS 1.       Examination: BP(146/92), Pulse(112), Blood Glucose(99) 1A: Level of Consciousness - Alert; keenly responsive + 0 1B: Ask Month and Age - Both Questions Right + 0 1C: Blink Eyes & Squeeze Hands - Performs Both Tasks + 0 2: Test Horizontal Extraocular Movements - Normal + 0 3: Test Visual Fields - No Visual Loss + 0 4: Test Facial Palsy (Use Grimace if Obtunded) - Normal symmetry + 0 5A: Test Left Arm Motor Drift - No Drift for 10 Seconds + 0 5B: Test Right Arm Motor Drift - No Drift for 10 Seconds + 0 6A: Test Left Leg Motor Drift - No Drift for 5 Seconds + 0 6B: Test Right Leg Motor Drift - No Drift for 5 Seconds + 0 7: Test Limb Ataxia (FNF/Heel-Shin) - No Ataxia + 0 8: Test Sensation - Mild-Moderate Loss: Less Sharp/More Dull + 1 9: Test Language/Aphasia - Normal; No aphasia + 0 10: Test Dysarthria - Normal + 0 11: Test Extinction/Inattention - No abnormality + 0  NIHSS Score: 1   Patient/Family was informed the Neurology Consult would happen via TeleHealth consult by way of interactive audio and video telecommunications and consented to receiving care in this manner.   Due to the immediate potential for life-threatening deterioration due to underlying acute neurologic illness, I spent 15 minutes providing critical care. This time includes time for face to face visit via telemedicine, review of medical records, imaging studies and discussion of findings with providers, the patient and/or family.   Dr Duffy Rhody  [facility]   [facility] [phone]   Case [id number]

## 2019-07-20 NOTE — ED Notes (Signed)
Pt states she is on her cycle and bleeds heavily, requested a new brief and would like to clean herself up. Pt given brief, underwear and wipes and ambulated to bathroom with steady gait.

## 2019-07-20 NOTE — ED Triage Notes (Signed)
State L facial numbness x 20 min, no other deficits noted. States had some L shoulder numbness 2 days ago but that had resolved.

## 2019-07-20 NOTE — ED Provider Notes (Signed)
[facility] Emergency Department Provider Note  ____________________________________________   First MD Initiated Contact with Patient 07/20/19 1730     (approximate)  I have reviewed the triage vital signs and the nursing notes.   HISTORY  Chief Complaint Numbness    HPI Sharon Dominguez is a 49 y.o. female  Here with left facial numbness, L arm numbness. L arm numbness reportedly began 3 days ago as a constant numb, decreased sensation along L shoulder to L arm. This has been fairly constant. Pt then experienced acute onset of L facial numbness just prior to arrival, involving entire hemiface. No weakness. No difficulty speaking or swallowing. No headache. Denies h/o CVA. 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 She had HTN, DM, HLD prior to surgery that is now improved. No HA. No other complaints.      History reviewed. No pertinent past medical history.  There are no problems to display for this patient.   Past Surgical History:  Procedure Laterality Date  . GASTRECTOMY      Prior to Admission medications   Medication Sig Start Date End Date Taking? Authorizing Provider  acarbose (PRECOSE) 25 MG tablet Take by mouth.    [provider]  acetaminophen (TYLENOL) 160 MG/5ML solution Take by mouth.    [provider]  amitriptyline (ELAVIL) 100 MG tablet Take by mouth. 04/25/19   [provider]  buPROPion (WELLBUTRIN) 100 MG tablet Take by mouth. 04/23/19   [provider]  butalbital-acetaminophen-caffeine (FIORICET) 50-325-40 MG tablet Take by mouth. 11/15/16   [provider]  Calcium Carb-Cholecalciferol 500-400 MG-UNIT CHEW Chew by mouth.    [provider]  cyanocobalamin (,VITAMIN B-12,) 1000 MCG/ML injection cyanocobalamin (vit B-12) 1,000 mcg/mL injection solution every 60 days    [provider]  dexlansoprazole (DEXILANT) 60 MG capsule Take by  mouth. 06/16/16   [provider]  diphenoxylate-atropine (LOMOTIL) 2.5-0.025 MG tablet Take by mouth. 04/24/19   [provider]  dronabinol (MARINOL) 5 MG capsule Take by mouth. 04/23/19 07/22/19  [provider]  Hyoscyamine Sulfate SL 0.125 MG SUBL Take by mouth. 06/20/18   [provider]  morphine 10 MG/5ML solution Take by mouth. 06/10/19   [provider]  Multiple Vitamins tablet Take by mouth.    [provider]  promethazine (PHENERGAN) 25 MG suppository  05/28/18   [provider]  Promethazine HCl 6.25 MG/5ML SOLN  06/04/19   [provider]  sucralfate (CARAFATE) 1 GM/10ML suspension Take by mouth. 04/27/18   [provider]    Allergies Nsaids  No family history on file.  Social History Social History   Tobacco Use  . Smoking status: Never Smoker  . Smokeless tobacco: Never Used  Substance Use Topics  . Alcohol use: Yes  . Drug use: Never    Review of Systems  Review of Systems  Constitutional: Positive for fatigue. Negative for fever.  HENT: Negative for congestion and sore throat.   Eyes: Negative for visual disturbance.  Respiratory: Negative for cough and shortness of breath.   Cardiovascular: Negative for chest pain.  Gastrointestinal: Negative for abdominal pain, diarrhea, nausea and vomiting.  Genitourinary: Negative for flank pain.  Musculoskeletal: Negative for back pain and neck pain.  Skin: Negative for rash and wound.  Neurological: Positive for numbness. Negative for weakness.  All other systems reviewed and are negative.    ____________________________________________  PHYSICAL EXAM:  VITAL SIGNS: ED Triage Vitals  Enc Vitals Group     BP 07/20/19 1650 (!) 146/92     Pulse Rate 07/20/19 1650 (!) 112     Resp 07/20/19 1650 20     Temp 07/20/19 1650 98.5 F (36.9 C)     Temp Source 07/20/19 1650 Oral     SpO2 07/20/19 1650 100 %     Weight 07/20/19 1651  148 lb (67.1 kg)     Height 07/20/19 1651 5\' 3"  (1.6 m)     Head Circumference --      Peak Flow --      Pain Score 07/20/19 1651 0     Pain Loc --      Pain Edu? --      Excl. in GC? --      Physical Exam Vitals and nursing note reviewed.  Constitutional:      General: She is not in acute distress.    Appearance: She is well-developed.  HENT:     Head: Normocephalic and atraumatic.  Eyes:     Conjunctiva/sclera: Conjunctivae normal.  Cardiovascular:     Rate and Rhythm: Normal rate and regular rhythm.     Heart sounds: Normal heart sounds.  Pulmonary:     Effort: Pulmonary effort is normal. No respiratory distress.     Breath sounds: No wheezing.  Abdominal:     General: There is no distension.  Musculoskeletal:     Cervical back: Neck supple.  Skin:    General: Skin is warm.     Capillary Refill: Capillary refill takes less than 2 seconds.     Findings: No rash.  Neurological:     Mental Status: She is alert and oriented to person, place, and time.     Motor: No abnormal muscle tone.     Comments: Neurological Exam:  Mental Status: Alert and oriented to person, place, and time. Attention and concentration normal. Speech clear. Recent memory is intact. Cranial Nerves: Visual fields grossly intact. EOMI and PERRLA. No nystagmus noted. Facial sensation intact at forehead, maxillary cheek, and chin/mandible bilaterally though subjectively diminished L hemiface. No facial asymmetry or weakness. Hearing grossly normal. Uvula is midline, and palate elevates symmetrically. Normal SCM and trapezius strength. Tongue midline without fasciculations. Motor: Muscle strength 5/5 in proximal and distal UE and LE bilaterally. No pronator drift. Muscle tone normal. Sensation: Intact to light touch in upper and lower extremities distally bilaterally though subjectively diminished throughout LUE Coordination: Normal FTN         ____________________________________________    LABS (all labs ordered are listed, but only abnormal results are displayed)  Labs Reviewed  COMPREHENSIVE METABOLIC PANEL - Abnormal; Notable for the following components:      Result Value   Glucose, Bld 103 (*)    All other components within normal limits  PROTIME-INR  APTT  CBC  DIFFERENTIAL  GLUCOSE, CAPILLARY  I-STAT CREATININE, ED  CBG MONITORING, ED  POC URINE PREG, ED    ____________________________________________  EKG: Sinus tachycardia, VR 101. PR 120, QRS 100, QTc 453. No acute ST elevations or depressions. ________________________________________  RADIOLOGY All imaging, including plain films, CT scans, and ultrasounds, independently reviewed by me, and interpretations confirmed via formal radiology reads.  ED MD interpretation:   CT Head: Neg  Official radiology report(s): CT HEAD CODE STROKE WO CONTRAST  Result Date: 07/20/2019 CLINICAL DATA:  Code stroke. Left facial numbness beginning 1625 hours. EXAM: CT HEAD WITHOUT CONTRAST TECHNIQUE: Contiguous axial images  were obtained from the base of the skull through the vertex without intravenous contrast. COMPARISON:  None. FINDINGS: Brain: Normal appearance of the brain without evidence of old or acute infarction, mass lesion, hemorrhage, hydrocephalus or extra-axial collection. Vascular: No abnormal vascular finding. Skull: Normal Sinuses/Orbits: Clear/normal Other: None ASPECTS ([place] Stroke Program Early CT Score) - Ganglionic level infarction (caudate, lentiform nuclei, internal capsule, insula, M1-M3 cortex): 7 - Supraganglionic infarction (M4-M6 cortex): 3 Total score (0-10 with 10 being normal): 10 IMPRESSION: 1. Normal head CT 2. ASPECTS is 10. 3. Call report in progress at 1712 hours.  Currently no answer. Electronically Signed   By: Nelson Chimes M.D.   On: 07/20/2019 17:12    ____________________________________________  PROCEDURES   Procedure(s) performed (including Critical  Care):  Procedures  ____________________________________________  INITIAL IMPRESSION / MDM / [place] / ED COURSE  As part of my medical decision making, I reviewed the following data within the [place] notes reviewed and incorporated, Old chart reviewed, Notes from prior ED visits, and [place] [employer]rolled Substance [place] was evaluated in Emergency Department on 07/20/2019 for the symptoms described in the history of present illness. She was evaluated in the context of the global COVID-19 pandemic, which necessitated consideration that the patient might be at risk for infection with the SARS-CoV-2 virus that causes COVID-19. Institutional protocols and algorithms that pertain to the evaluation of patients at risk for COVID-19 are in a state of rapid change based on information released by regulatory bodies including the CDC and federal and state organizations. These policies and algorithms were followed during the patient's care in the ED.  Some ED evaluations and interventions may be delayed as a result of limited staffing during the pandemic.*     Medical Decision Making:  49 yo F with prior h/o HTN, HLD, DM, s/p gastric bypass, here with L facial and arm numbness. Outside of tPA window but was activated as code stroke prior to my assessment - see recommendations. Dr. Oleta Mouse of Neuro recommends admission for CVA w/u. ASA held 2/2 prior gastric surgeries  ____________________________________________  FINAL CLINICAL IMPRESSION(S) / ED DIAGNOSES  Final diagnoses:  Stroke-like symptoms  Numbness     MEDICATIONS GIVEN DURING THIS VISIT:  Medications  sodium chloride flush (NS) 0.9 % injection 3 mL (3 mLs Intravenous Given 07/20/19 1850)  0.9 %  sodium chloride infusion ( Intravenous New Bag/Given 07/20/19 1850)  LORazepam (ATIVAN) injection 1 mg (1 mg Intravenous Given 07/20/19 1850)     ED Discharge Orders    None        Note:  This document was prepared using Dragon voice recognition software and may include unintentional dictation errors.   Duffy Bruce, MD 07/20/19 [phone]

## 2019-07-20 NOTE — ED Notes (Signed)
Pt otf for imaging 

## 2019-07-20 NOTE — ED Notes (Signed)
Dr. Mansy at bedside. 

## 2019-07-20 NOTE — Progress Notes (Signed)
CODE STROKE- PHARMACY COMMUNICATION   Time CODE STROKE called/page received:1722  Time response to CODE STROKE was made (in person or via phone): NA  Time Stroke Kit retrieved from [employer] (only if needed): NA  Name of Provider/Nurse contacted: NA (per neurology note, outside of tPA window)    Sharon Dominguez ,PharmD Clinical Pharmacist  07/20/2019  6:45 PM   

## 2019-07-20 NOTE — ED Notes (Signed)
Report given to Lexie, RN 

## 2019-07-20 NOTE — H&P (Addendum)
[place] at [facility]   PATIENT NAME: Sharon Dominguez    MR#:  [id number]  DATE OF BIRTH:  [birth year]  DATE OF ADMISSION:  07/20/2019  PRIMARY CARE PHYSICIAN: Zenaida Niece, MD   REQUESTING/REFERRING PHYSICIAN: Shaune Pollack, MD CHIEF COMPLAINT:   Chief Complaint  Patient presents with  . Numbness    HISTORY OF PRESENT ILLNESS:  Sharon Dominguez  is a 49 y.o. female with a known history of hypertension, GERD, depression and obstructive sleep apnea, who presented to the emergency room with onset of left facial and shoulder numbness with tingling and numbness in the fingers both hands that actually started a couple of days ago.  The patient denies any dysarthria or expressive dysphasia.  No dysphagia.  No headache or dizziness or blurred vision.  No other paresthesias or focal muscle weakness.  She denied chest pain or palpitations.  No tinnitus or vertigo.  She denied any bleeding diathesis.  Upon presentation to the emergency room, blood pressure was 135/94 with otherwise normal vital signs.  Labs revealed unremarkable CBC and CMP.  EKG showed sinus tachycardia with rate 101 and 2 view chest x-ray showed no acute cardiopulmonary disease.  COVID-19 PCR is currently pending.  Brain MRI was within normal. 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 Narrowing of the ventral subarachnoid space but no compression of the cord. AP diameter of the canal 8 mm. Mild foraminal narrowing on the right and moderate foraminal narrowing on the left. Some possibility that this could in particular affect the left C6 nerve. C6-7: Mild noncompressive disc bulge.  She was given 1 mg of IV Ativan for the MRI was started on IV normal saline at 100 mL/h.  She will be admitted to an observation medical monitor bed for further evaluation and management.  PAST MEDICAL HISTORY:  Hypertension, GERD, IBS, peripheral neuropathy,  vitamin B12 deficiency and depression, obstructive sleep apnea and dyslipidemia  PAST SURGICAL HISTORY:   Past Surgical History:  Procedure Laterality Date  . GASTRECTOMY      SOCIAL HISTORY:   Social History   Tobacco Use  . Smoking status: Never Smoker  . Smokeless tobacco: Never Used  Substance Use Topics  . Alcohol use: Yes    FAMILY HISTORY:    DRUG ALLERGIES:   Allergies  Allergen Reactions  . Nsaids     REVIEW OF SYSTEMS:   ROS As per history of present illness. All pertinent systems were reviewed above. Constitutional,  HEENT, cardiovascular, respiratory, GI, GU, musculoskeletal, neuro, psychiatric, endocrine,  integumentary and hematologic systems were reviewed and are otherwise  negative/unremarkable except for positive findings mentioned above in the HPI.   MEDICATIONS AT HOME:   Prior to Admission medications   Medication Sig Start Date End Date Taking? Authorizing Provider  acarbose (PRECOSE) 25 MG tablet Take by mouth.    [provider]  acetaminophen (TYLENOL) 160 MG/5ML solution Take by mouth.    [provider]  amitriptyline (ELAVIL) 100 MG tablet Take by mouth. 04/25/19   [provider]  buPROPion (WELLBUTRIN) 100 MG tablet Take by mouth. 04/23/19   [provider]  butalbital-acetaminophen-caffeine (FIORICET) 50-325-40 MG tablet Take by mouth. 11/15/16   [provider]  Calcium Carb-Cholecalciferol 500-400 MG-UNIT CHEW Chew by mouth.    [provider]  cyanocobalamin (,VITAMIN B-12,) 1000 MCG/ML injection cyanocobalamin (vit B-12) 1,000 mcg/mL injection solution every 60 days    [provider]  dexlansoprazole (DEXILANT) 60  MG capsule Take by mouth. 06/16/16   [provider]  diphenoxylate-atropine (LOMOTIL) 2.5-0.025 MG tablet Take by mouth. 04/24/19   [provider]  dronabinol (MARINOL) 5 MG capsule Take by mouth. 04/23/19 07/22/19  [provider]    Hyoscyamine Sulfate SL 0.125 MG SUBL Take by mouth. 06/20/18   [provider]  morphine 10 MG/5ML solution Take by mouth. 06/10/19   [provider]  Multiple Vitamins tablet Take by mouth.    [provider]  promethazine (PHENERGAN) 25 MG suppository  05/28/18   [provider]  Promethazine HCl 6.25 MG/5ML SOLN  06/04/19   [provider]  sucralfate (CARAFATE) 1 GM/10ML suspension Take by mouth. 04/27/18   [provider]      VITAL SIGNS:  Blood pressure (!) 128/99, pulse (!) 102, temperature 98.5 F (36.9 C), temperature source Oral, resp. rate 20, height 5\' 3"  (1.6 m), weight 67.1 kg, last menstrual period 07/19/2019, SpO2 98 %.  PHYSICAL EXAMINATION:  Physical Exam  GENERAL:  49 y.o.-year-old pleasant Caucasian female patient lying in the bed with no acute distress.  EYES: Pupils equal, round, reactive to light and accommodation. No scleral icterus. Extraocular muscles intact.  HEENT: Head atraumatic, normocephalic. Oropharynx and nasopharynx clear.  NECK:  Supple, no jugular venous distention. No thyroid enlargement, no tenderness.  LUNGS: Normal breath sounds bilaterally, no wheezing, rales,rhonchi or crepitation. No use of accessory muscles of respiration.  CARDIOVASCULAR: Regular rate and rhythm, S1, S2 normal. No murmurs, rubs, or gallops.  ABDOMEN: Soft, nondistended, nontender. Bowel sounds present. No organomegaly or mass.  EXTREMITIES: No pedal edema, cyanosis, or clubbing.  NEUROLOGIC: Cranial nerves II through XII are intact. Muscle strength 5/5 in all extremities. Sensation intact except for diminished light touch sensation on the left side of the face as well as the left shoulder.  Gait not checked.  PSYCHIATRIC: The patient is alert and oriented x 3.  Normal affect and good eye contact. SKIN: No obvious rash, lesion, or ulcer.   LABORATORY PANEL:   CBC Recent Labs  Lab 07/20/19 1721  WBC 6.5  HGB 12.9   HCT 40.4  PLT 289   ------------------------------------------------------------------------------------------------------------------  Chemistries  Recent Labs  Lab 07/20/19 1721  NA 138  K 4.0  CL 103  CO2 27  GLUCOSE 103*  BUN 11  CREATININE 0.79  CALCIUM 9.1  AST 19  ALT 21  ALKPHOS 95  BILITOT 0.3   ------------------------------------------------------------------------------------------------------------------  Cardiac Enzymes No results for input(s): TROPONINI in the last 168 hours. ------------------------------------------------------------------------------------------------------------------  RADIOLOGY:  CT HEAD CODE STROKE WO CONTRAST  Result Date: 07/20/2019 CLINICAL DATA:  Code stroke. Left facial numbness beginning 1625 hours. EXAM: CT HEAD WITHOUT CONTRAST TECHNIQUE: Contiguous axial images were obtained from the base of the skull through the vertex without intravenous contrast. COMPARISON:  None. FINDINGS: Brain: Normal appearance of the brain without evidence of old or acute infarction, mass lesion, hemorrhage, hydrocephalus or extra-axial collection. Vascular: No abnormal vascular finding. Skull: Normal Sinuses/Orbits: Clear/normal Other: None ASPECTS ([place] Stroke Program Early CT Score) - Ganglionic level infarction (caudate, lentiform nuclei, internal capsule, insula, M1-M3 cortex): 7 - Supraganglionic infarction (M4-M6 cortex): 3 Total score (0-10 with 10 being normal): 10 IMPRESSION: 1. Normal head CT 2. ASPECTS is 10. 3. Call report in progress at 1712 hours.  Currently no answer. Electronically Signed   By: 07/22/2019 M.D.   On: 07/20/2019 17:12      IMPRESSION AND PLAN:   1.  Transient  ischemic attack with left facial and upper extremity numbness. The patient will be admitted to an observation medically monitored bed.  We will follow neuro checks q.4 hours for 24 hours.  The patient will be placed on Plavix.  Will obtain bilateral carotid  Doppler and 2D echo with bubble study . Physical/occupation/speech therapy consults will be obtained in a.m..  The patient will be placed on statin therapy and fasting lipids will be checked.  2.  Hypokalemia.  We will replace potassium and check magnesium level.  3. GERD.  We will continue PPI therapy and Carafate.  4.  Migraine headaches.  We will continue as needed Fioricet.  5.  DVT prophylaxis.  Subcutaneous Lovenox.    All the records are reviewed and case discussed with ED provider. The plan of care was discussed in details with the patient (and family). I answered all questions. The patient agreed to proceed with the above mentioned plan. Further management will depend upon hospital course.   CODE STATUS: Full code  TOTAL TIME TAKING CARE OF THIS PATIENT: 55 minutes.    Christel Mormon M.D on 07/20/2019 at 8:00 PM  [facility]   From 7 PM-7 AM, contact night-coverage [email or web address]  CC: Primary care physician; Elon Alas, MD   Note: This dictation was prepared with Dragon dictation along with smaller phrase technology. Any transcriptional errors that result from this process are unintentional.

## 2019-07-20 NOTE — ED Notes (Signed)
States numbness L face slightly less, states it is now "patchy", no other deficits

## 2019-07-20 NOTE — ED Notes (Signed)
Tele neuro eval

## 2019-07-20 NOTE — Progress Notes (Signed)
   07/20/19 1800  Clinical Encounter Type  Visited With Patient  Visit Type Initial  Referral From Nurse  Consult/Referral To Chaplain  Stress Factors  Patient Stress Factors Other (Comment) (Patient was nervous due to not having husband with her. )  Chaplain visited with patient in the ED. Patient was laying in the bed in good spirits. Chaplain offered emotional support due to patient's husband not being able to be by her side. Chaplain also offered blessings as patient was unsure of her medical condition at the current moment.

## 2019-07-20 NOTE — ED Notes (Signed)
Pt given turkey sandwich tray and ginger ale. 

## 2019-07-20 NOTE — ED Notes (Signed)
Assessment same, talking on phone with husband.

## 2019-07-21 ENCOUNTER — Observation Stay: Payer: BC Managed Care – PPO

## 2019-07-21 DIAGNOSIS — G43109 Migraine with aura, not intractable, without status migrainosus: Secondary | ICD-10-CM | POA: Diagnosis not present

## 2019-07-21 DIAGNOSIS — R2990 Unspecified symptoms and signs involving the nervous system: Secondary | ICD-10-CM | POA: Diagnosis not present

## 2019-07-21 LAB — HEMOGLOBIN A1C
Hgb A1c MFr Bld: 4.8 % (ref 4.8–5.6)
Mean Plasma Glucose: 91.06 mg/dL

## 2019-07-21 LAB — LIPID PANEL
Cholesterol: 154 mg/dL (ref 0–200)
HDL: 42 mg/dL (ref >40)
LDL Cholesterol: 84 mg/dL (ref 0–99)
Total CHOL/HDL Ratio: 3.7 RATIO
Triglycerides: 139 mg/dL (ref <150)
VLDL: 28 mg/dL (ref 0–40)

## 2019-07-21 LAB — HIV ANTIBODY (ROUTINE TESTING W REFLEX): HIV Screen 4th Generation wRfx: NONREACTIVE

## 2019-07-21 LAB — SARS CORONAVIRUS 2 (TAT 6-24 HRS): SARS Coronavirus 2: NEGATIVE

## 2019-07-21 IMAGING — MR MR CERVICAL SPINE W/ CM
3 series · 22 of 48 positions shown · IV contrast (7ml Gadavist)
Comparison: MRI of the cervical spine without contrast [DATE]

CLINICAL DATA: Demyelinating disease.

EXAM:
MRI CERVICAL SPINE WITH CONTRAST
TECHNIQUE: Multiplanar, multisequence MR imaging of the cervical spine was
performed following the administration of intravenous contrast.

[Series 5: T1 · axial · non-contrast · 3.0mm · 0.35mm/px · z∈[-133,-51]mm · 10 of 29 slices shown]
[im 2/29]
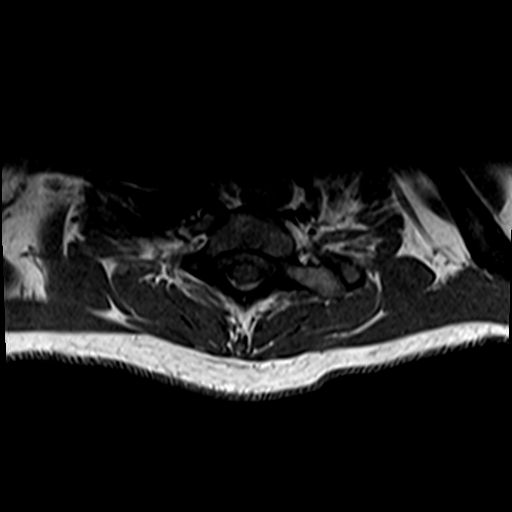
[im 5/29]
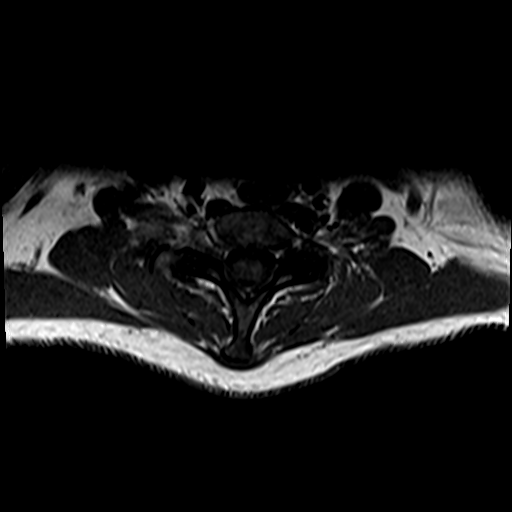
[im 9/29]
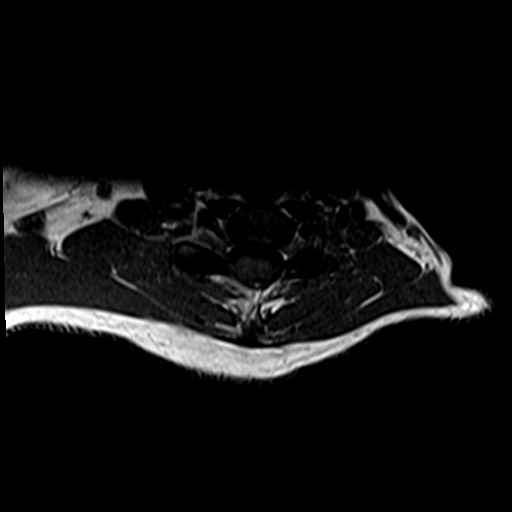
[im 12/29]
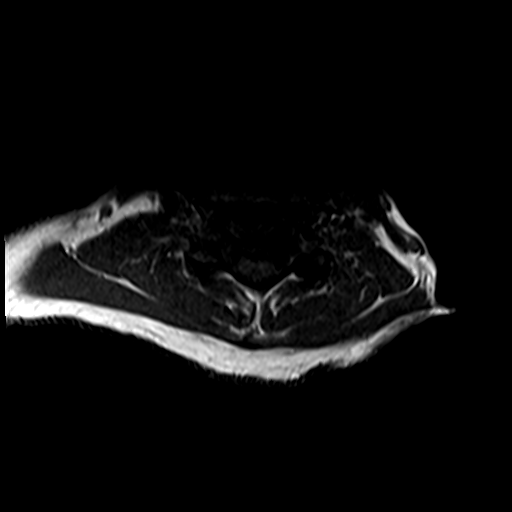
[im 15/29]
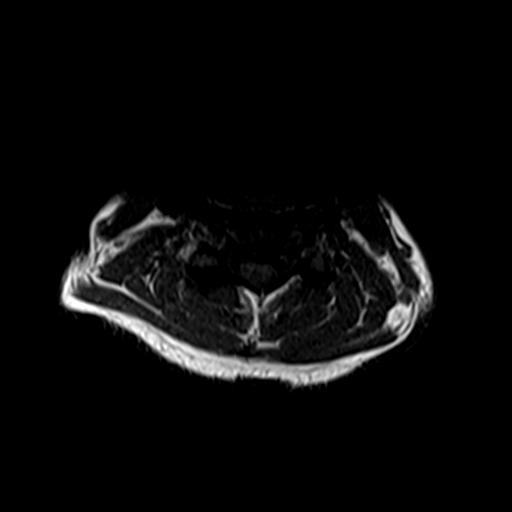
[im 17/29]
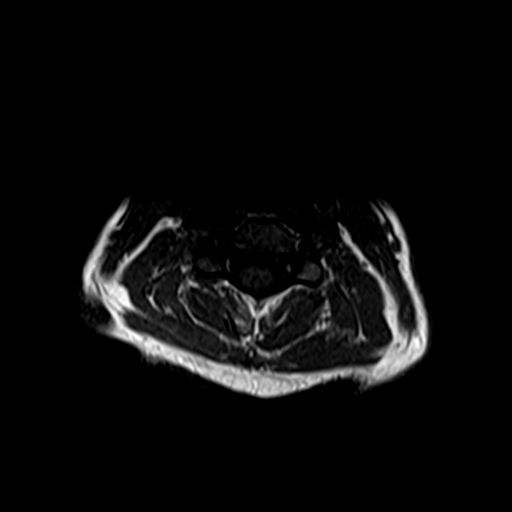
[im 20/29]
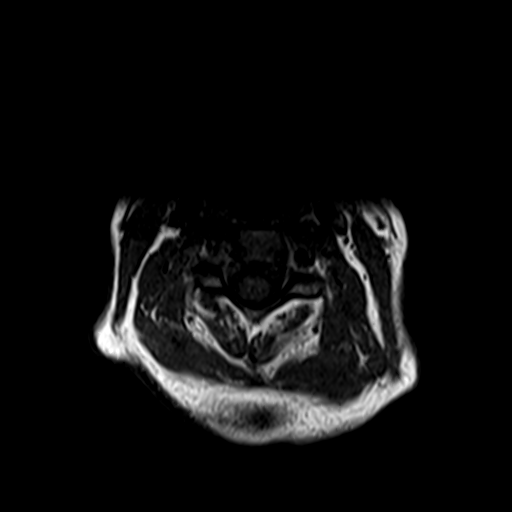
[im 24/29]
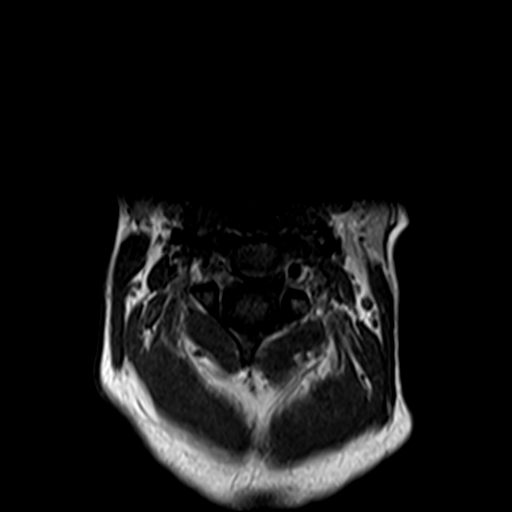
[im 25/29]
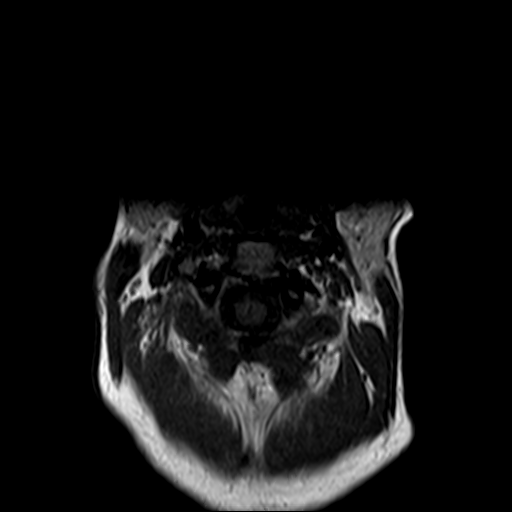
[im 27/29]
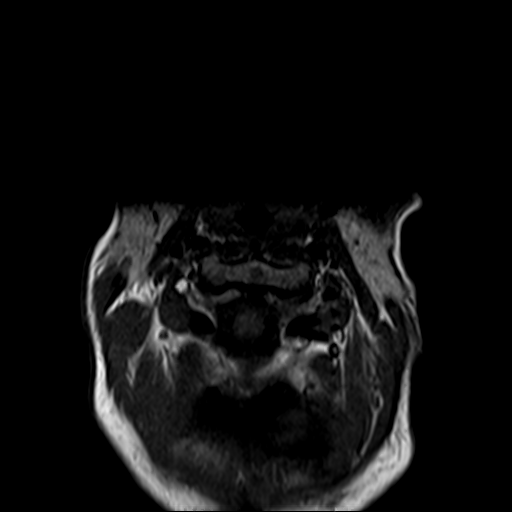

[Series 6: T1 post-contrast · axial · 3.0mm · 0.35mm/px · z∈[-133,-51]mm · 9 of 31 slices shown]
[im 2/31]
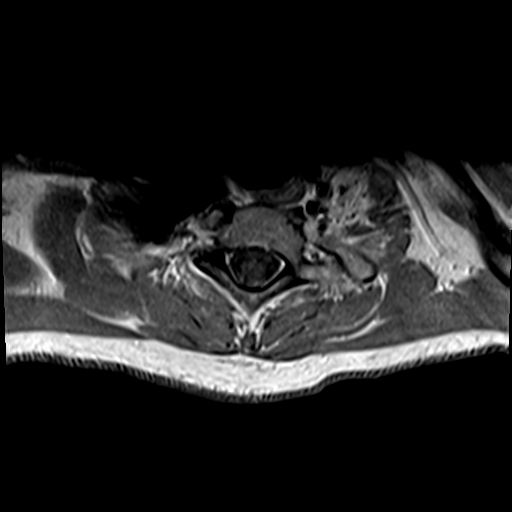
[im 5/31]
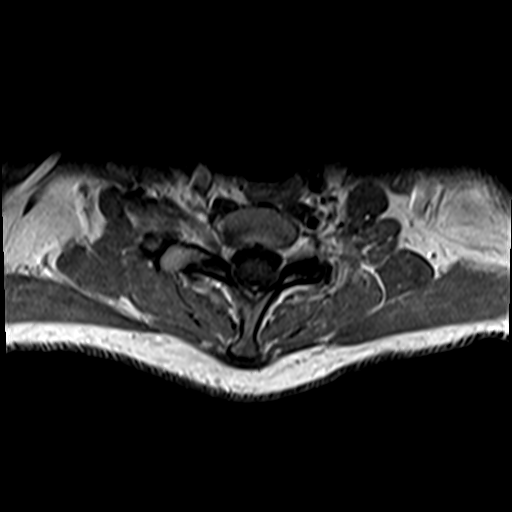
[im 10/31]
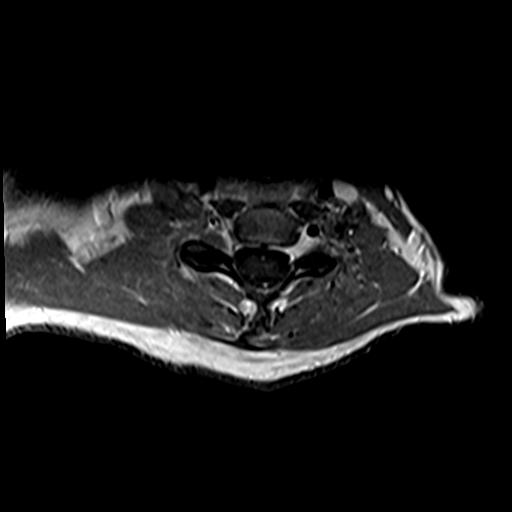
[im 13/31]
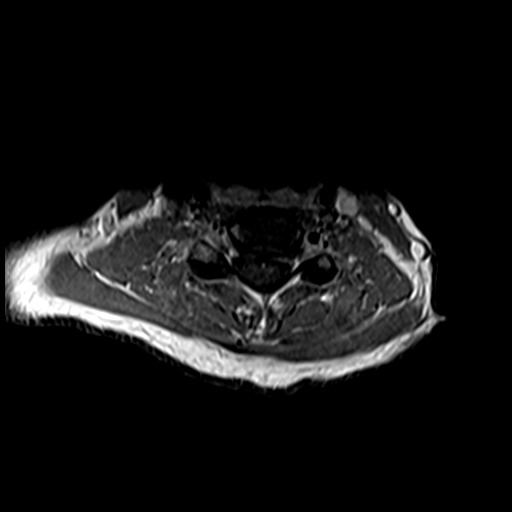
[im 16/31]
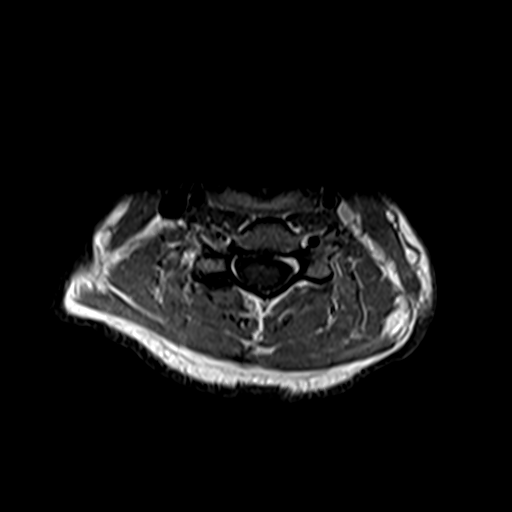
[im 18/31]
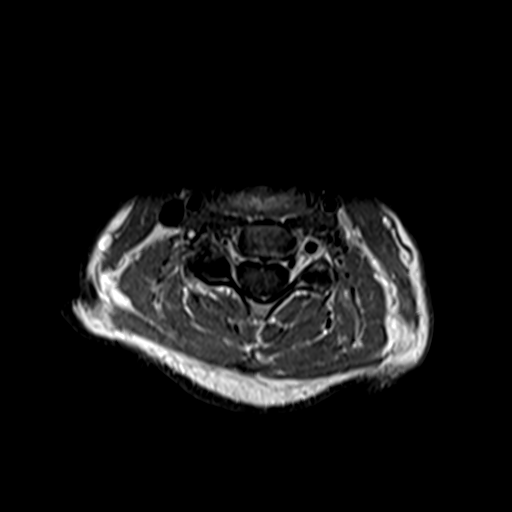
[im 21/31]
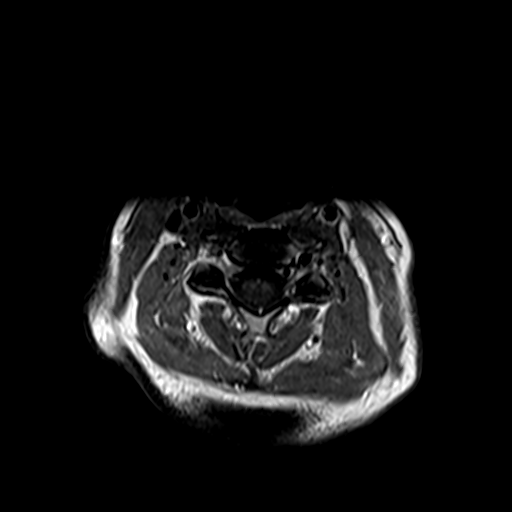
[im 26/31]
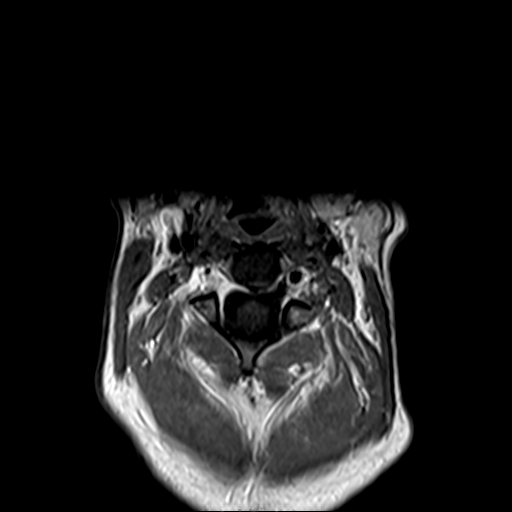
[im 29/31]
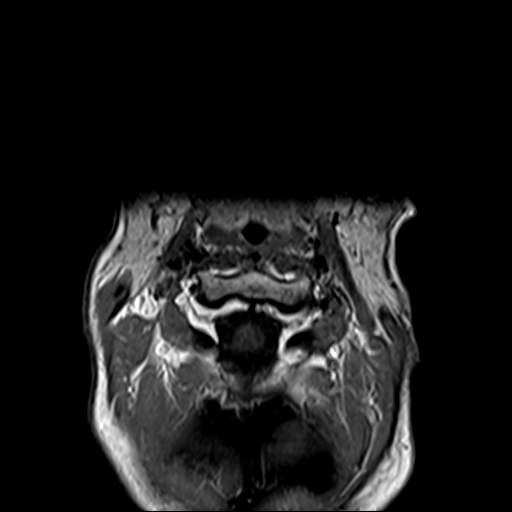

[Series 7: FLAIR fat-sat post-contrast · sagittal · 3.0mm · 0.78mm/px · 3 of 15 slices shown]
[im 2/15]
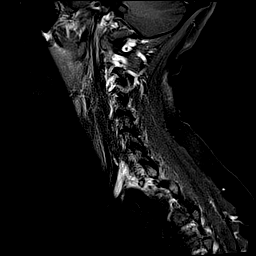
[im 8/15]
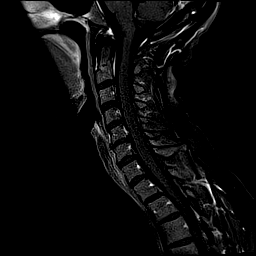
[im 13/15]
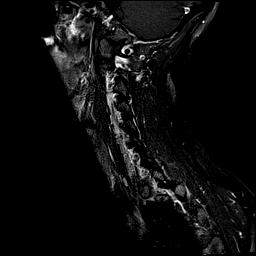

[22 of 48 positions shown; findings below may reference images not displayed]

FINDINGS: Alignment: Stable

Vertebrae: No enhancing lesions are present. Vertebral body heights
are maintained.

Cord: There is no abnormal enhancement of the cord. No parenchymal
enhancement is present. Anterior spinal artery is within normal
limits.

Posterior Fossa, vertebral arteries, paraspinal tissues:
Craniocervical junction is normal. Flow is present in the vertebral
arteries bilaterally. Visualized intracranial contents are normal.
IMPRESSION: No parenchymal enhancement of the cord to suggest malignancy or
demyelinating disease.

## 2019-07-21 IMAGING — MR MR HEAD W/ CM
3 series · 48 of 48 positions shown · IV contrast (gadavist)
Comparison: MR head without contrast [DATE]

CLINICAL DATA: Demyelinating disease.

EXAM:
MRI HEAD WITH CONTRAST
TECHNIQUE: Multiplanar, multiecho pulse sequences of the brain and surrounding
structures were obtained with intravenous contrast.
CONTRAST:  7mL GADAVIST GADOBUTROL 1 MMOL/ML IV SOLN

[Series 5: T1 · axial · 1.0mm · 0.98mm/px · z∈[+12,+185]mm · 22 of 174 slices shown]
[im 1/174]
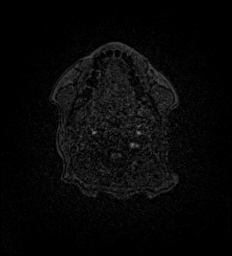
[im 9/174]
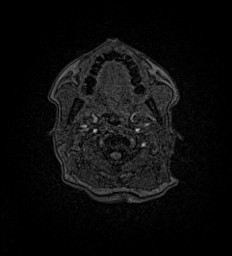
[im 17/174]
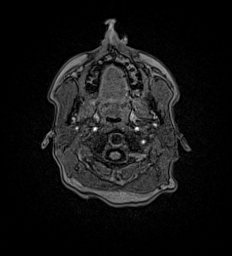
[im 25/174]
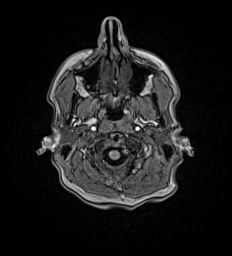
[im 33/174]
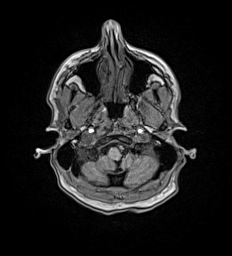
[im 42/174]
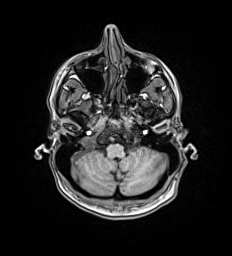
[im 50/174]
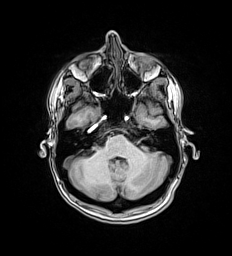
[im 58/174]
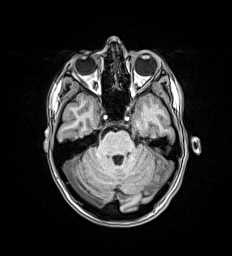
[im 66/174]
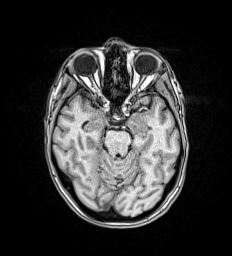
[im 75/174]
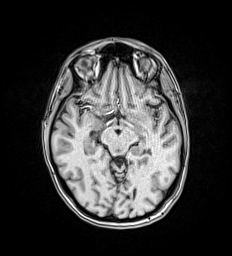
[im 83/174]
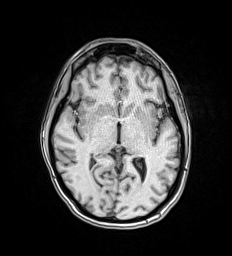
[im 91/174]
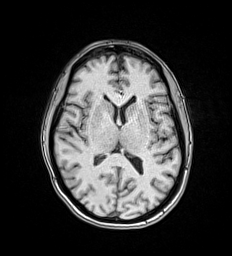
[im 99/174]
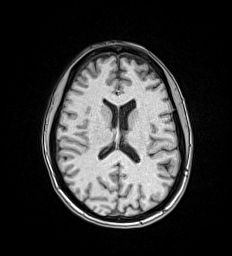
[im 108/174]
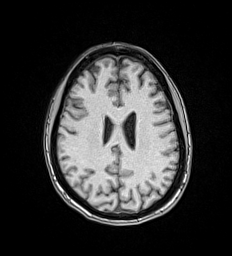
[im 116/174]
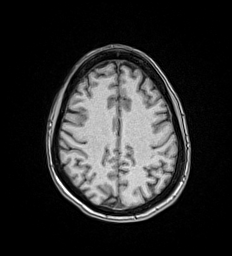
[im 124/174]
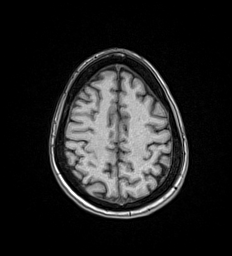
[im 132/174]
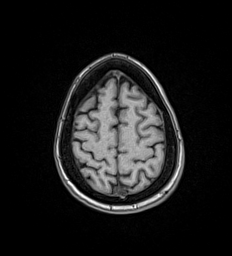
[im 141/174]
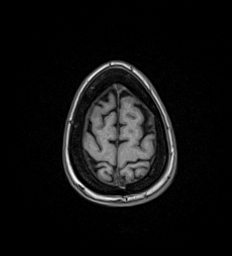
[im 149/174]
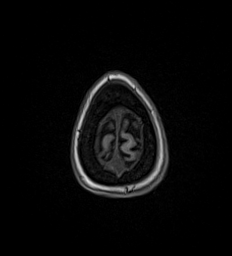
[im 157/174]
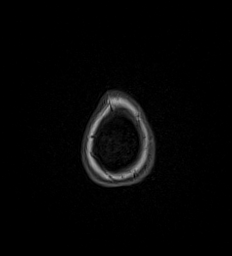
[im 165/174]
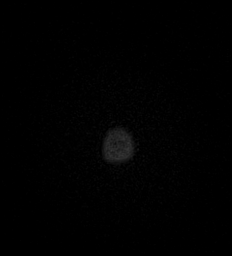
[im 174/174]
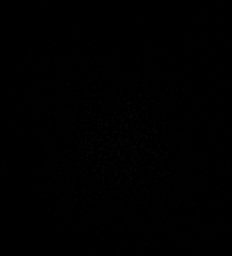

[Series 6: T1 post-contrast · axial · 1.0mm · 0.98mm/px · z∈[+12,+186]mm · 22 of 175 slices shown (1 of 2)]
[im 1/175]
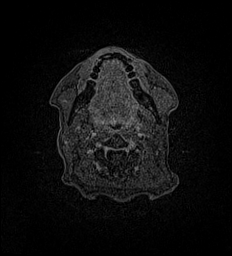
[im 9/175]
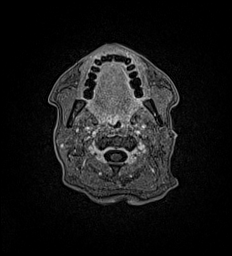
[im 17/175]
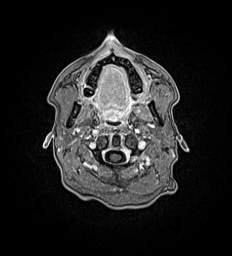
[im 25/175]
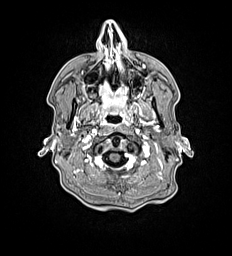
[im 34/175]
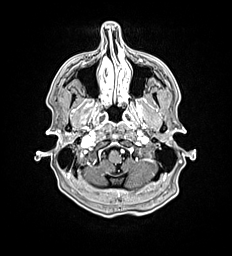
[im 42/175]
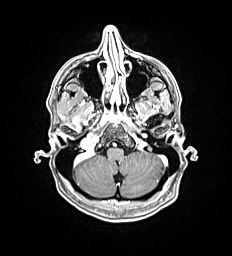
[im 50/175]
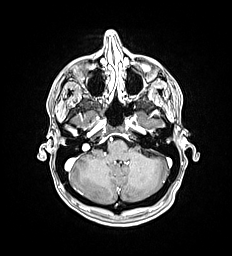
[im 59/175]
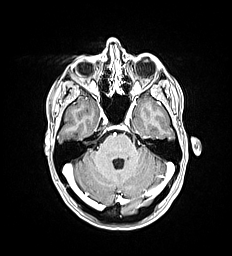
[im 67/175]
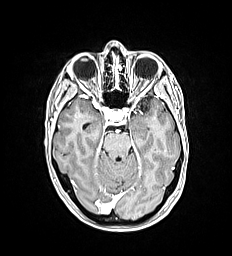
[im 75/175]
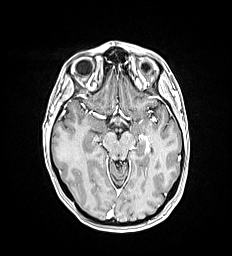
[im 83/175]
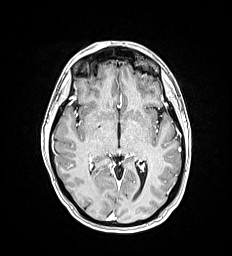
[im 92/175]
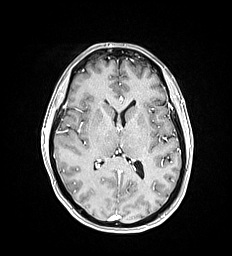
[im 100/175]
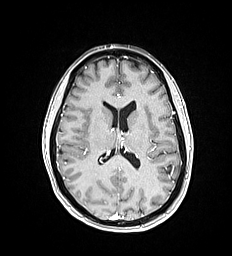
[im 108/175]
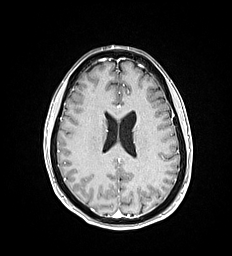
[im 117/175]
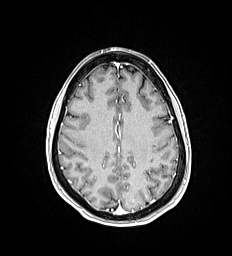
[im 125/175]
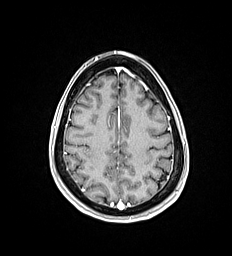
[im 133/175]
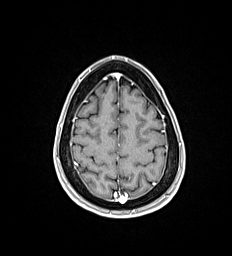
[im 141/175]
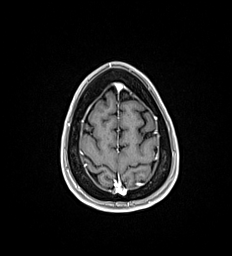
[im 150/175]
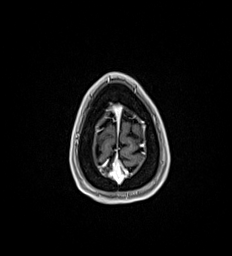
[im 158/175]
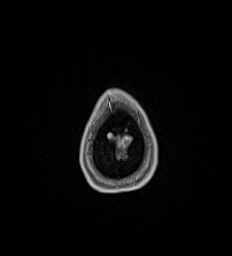
[im 166/175]
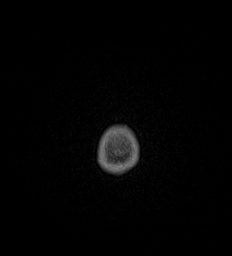
[im 175/175]
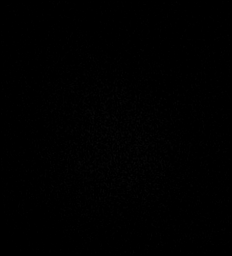

[Series 7: T1 post-contrast · coronal · 5.0mm · 0.57mm/px · 4 of 29 slices shown (2 of 2)]
[im 1/29]
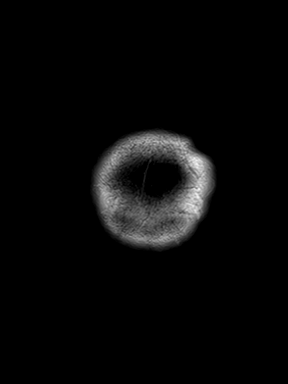
[im 10/29]
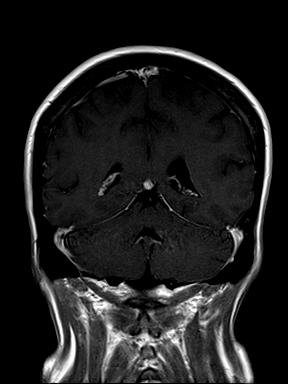
[im 19/29]
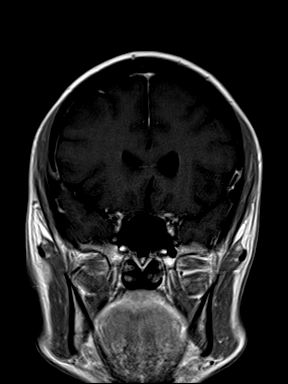
[im 29/29]
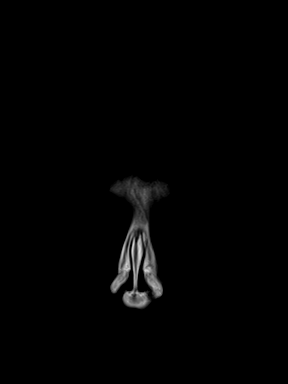

[48 of 48 positions shown; findings below may reference images not displayed]

FINDINGS: Brain: Postcontrast images demonstrate no pathologic enhancement. No
focal lesions are present. The dura images are within normal limits.

Vascular: Normal contrast enhancement

Skull and upper cervical spine: Enhancing lesions are present.
Calvarium and scalp are within normal limits.

Sinuses/Orbits: No abnormal enhancement is present in the sinuses or
orbits.
IMPRESSION: Normal postcontrast MRI appearance of the brain.

## 2019-07-21 IMAGING — US US CAROTID DUPLEX BILAT
1 series · 13 of 24 positions shown · non-contrast
Comparison: None.

CLINICAL DATA: TIA, left facial numbness and arm numbness.
hypertension, hyperlipidemia , diabetes

EXAM:
BILATERAL CAROTID DUPLEX ULTRASOUND
TECHNIQUE: Gray scale imaging, color Doppler and duplex ultrasound were
performed of bilateral carotid and vertebral arteries in the neck.

[Series 1: us carotid duplex bilat · 13 of 66 slices shown]
[im 1/66]
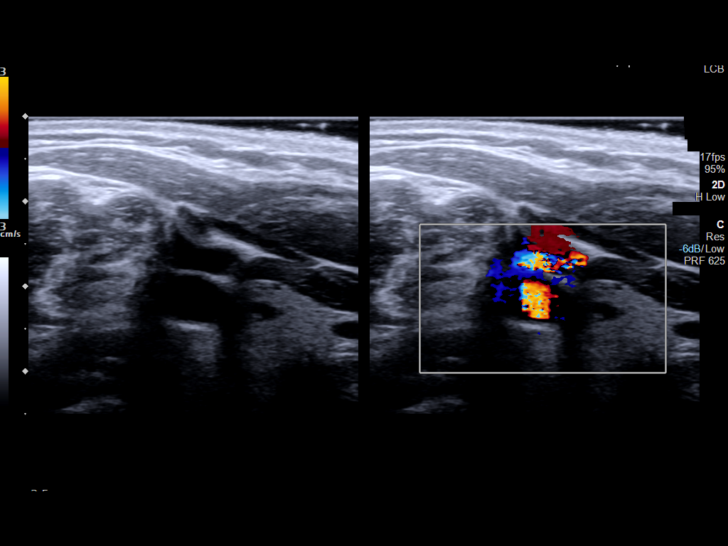
[im 6/66]
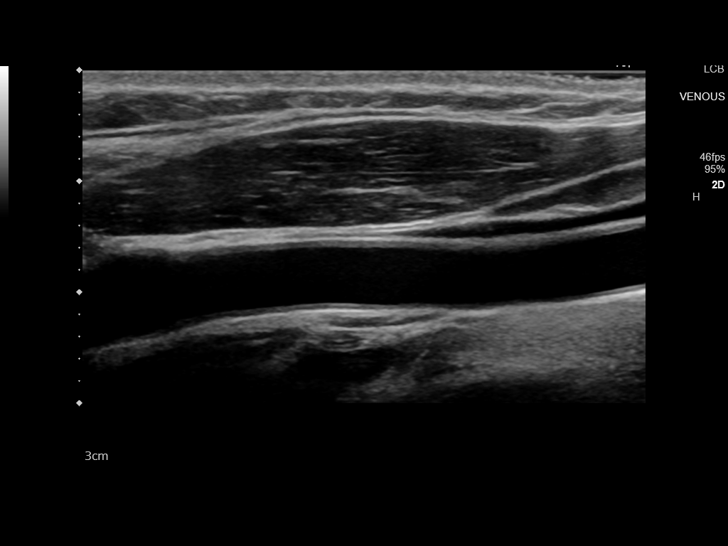
[im 12/66]
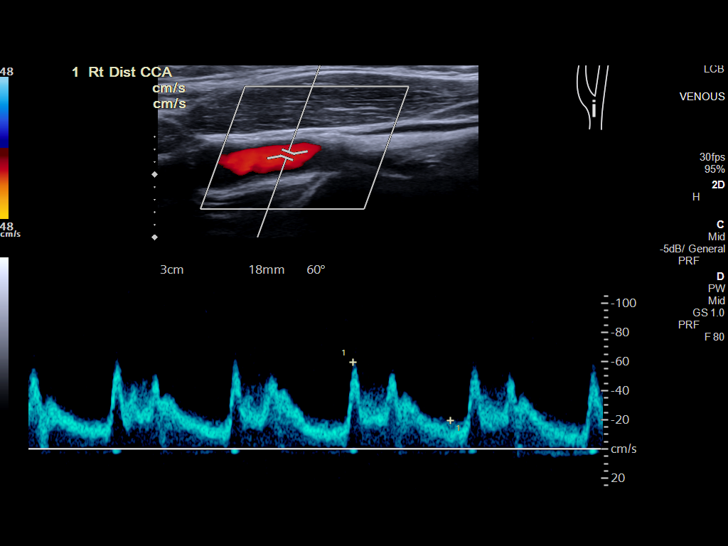
[im 17/66]
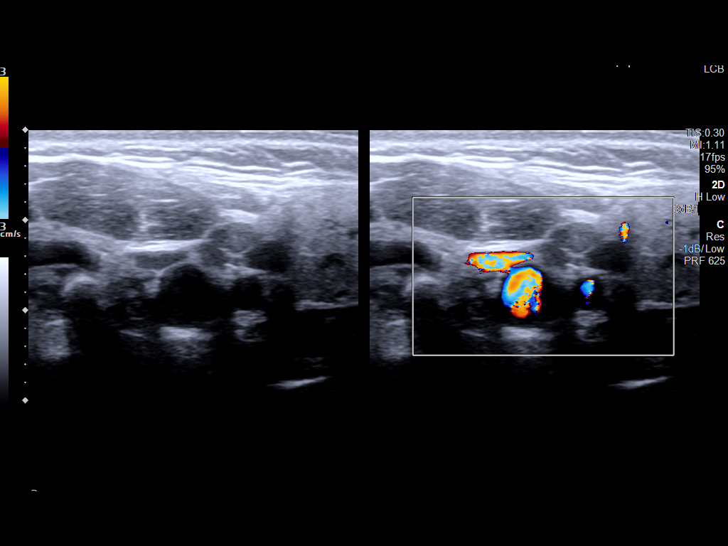
[im 23/66]
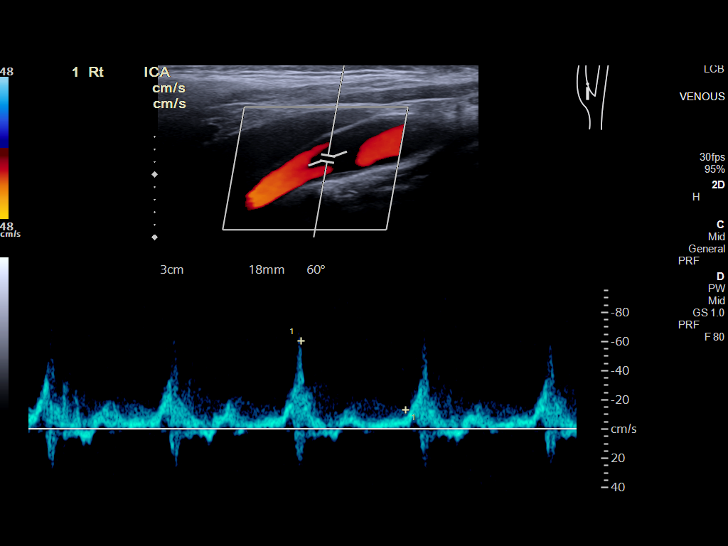
[im 29/66]
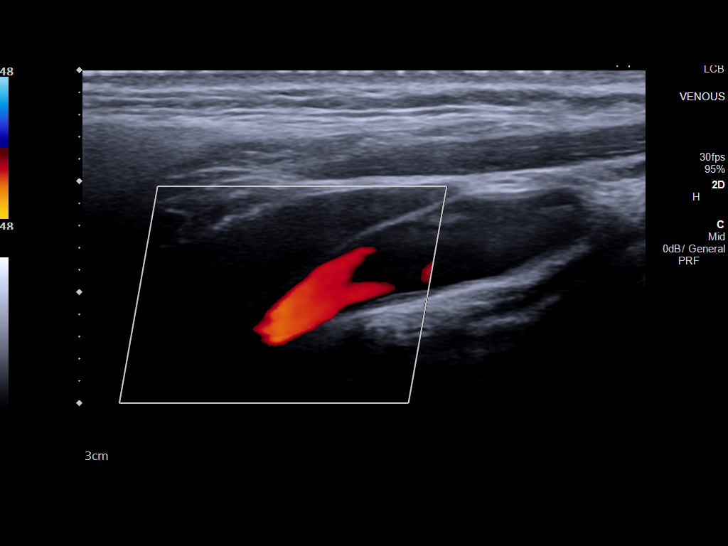
[im 34/66]
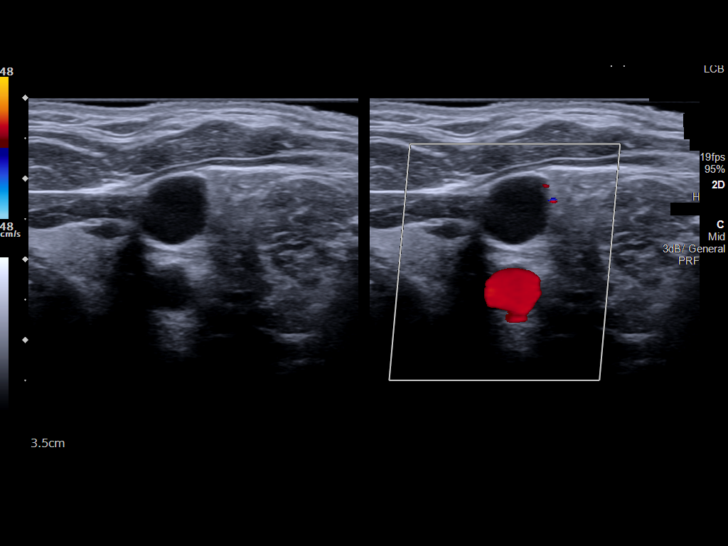
[im 37/66]
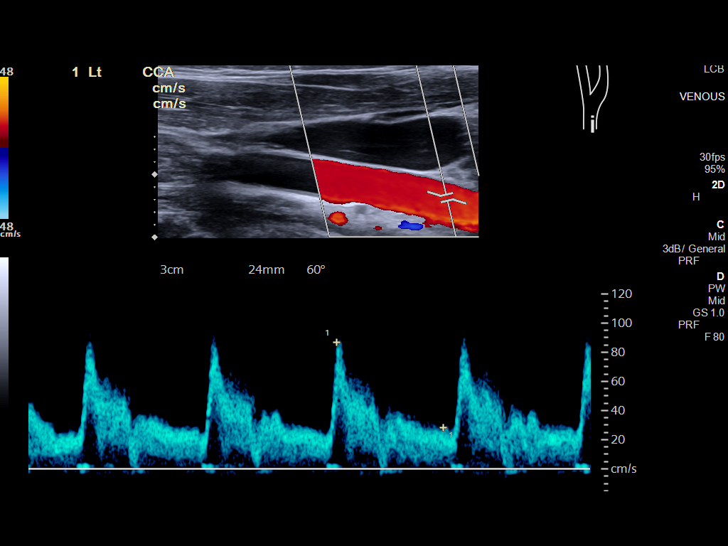
[im 43/66]
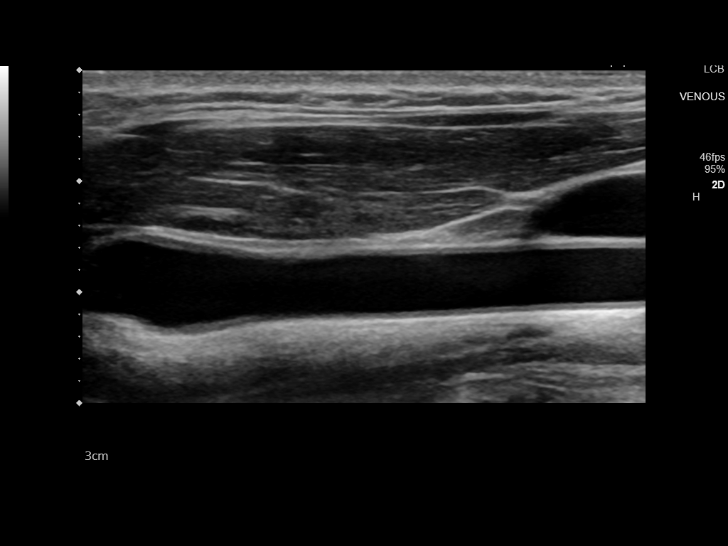
[im 49/66]
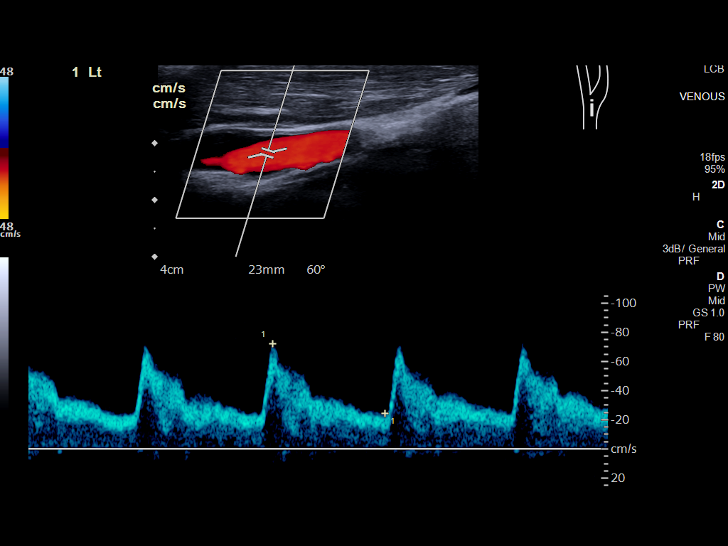
[im 54/66]
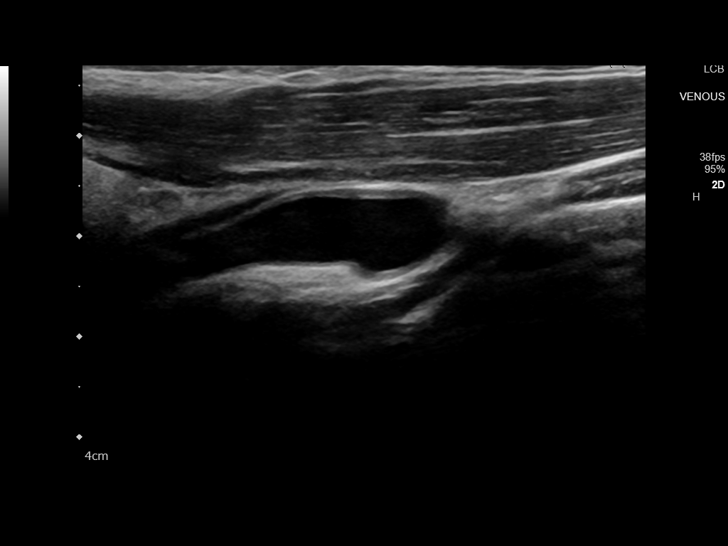
[im 60/66]
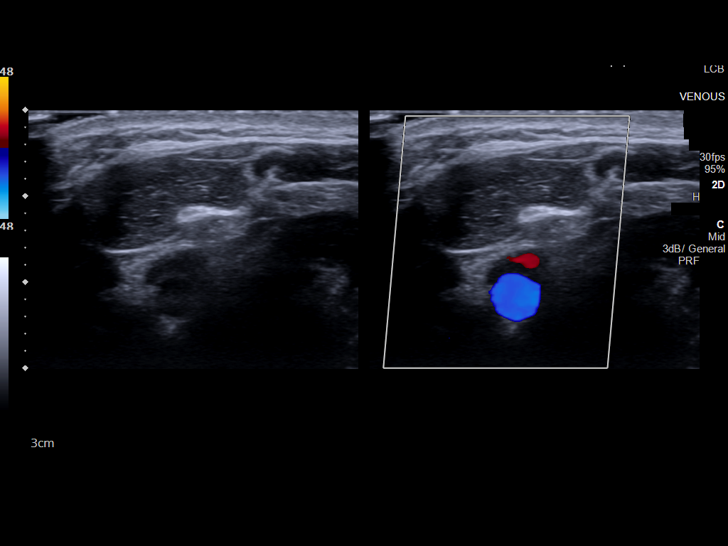
[im 66/66]
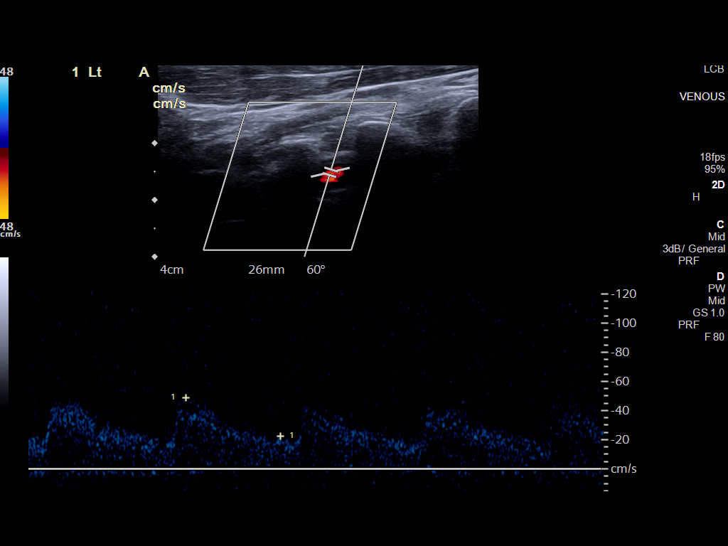

[13 of 24 positions shown; findings below may reference images not displayed]

FINDINGS: Criteria: Quantification of carotid stenosis is based on velocity
parameters that correlate the residual internal carotid diameter
with NASCET-based stenosis levels, using the diameter of the distal
internal carotid lumen as the denominator for stenosis measurement.

The following velocity measurements were obtained:

RIGHT

ICA: 79/38 cm/sec

CCA: 60/21 cm/sec

SYSTOLIC ICA/CCA RATIO:

ECA: 67 cm/sec

LEFT

ICA: 82/44 cm/sec

CCA: 76/20 cm/sec

SYSTOLIC ICA/CCA RATIO:

ECA: 58 cm/sec

RIGHT CAROTID ARTERY: mild intimal thickening in the common carotid
artery. no significant plaque accumulation or stenosis. Normal
waveforms and color Doppler signal.

RIGHT VERTEBRAL ARTERY:  Normal flow direction and waveform.

LEFT CAROTID ARTERY: Mild intimal thickening in the bulb. No
significant plaque or stenosis. Normal waveforms and color Doppler
signal.

LEFT VERTEBRAL ARTERY:  Normal flow direction and waveform.
IMPRESSION: 1. No significant carotid plaque or stenosis.
2. Antegrade bilateral vertebral arterial flow.

## 2019-07-21 MED ORDER — LORAZEPAM 2 MG/ML IJ SOLN
INTRAMUSCULAR | Status: AC
  Filled 2019-07-21: qty 1

## 2019-07-21 MED ORDER — AMITRIPTYLINE HCL 50 MG PO TABS
100.0000 mg | ORAL_TABLET | Freq: Every day | ORAL | Status: DC
  Administered 2019-07-21: 100 mg via ORAL
  Filled 2019-07-21: qty 2

## 2019-07-21 MED ORDER — PROMETHAZINE HCL 25 MG PO TABS
25.0000 mg | ORAL_TABLET | Freq: Four times a day (QID) | ORAL | Status: DC | PRN
  Administered 2019-07-21: 02:00:00 25 mg via ORAL
  Filled 2019-07-21: qty 1

## 2019-07-21 MED ORDER — DRONABINOL 5 MG PO CAPS
5.0000 mg | ORAL_CAPSULE | Freq: Two times a day (BID) | ORAL | Status: DC
  Filled 2019-07-21 (×2): qty 1

## 2019-07-21 MED ORDER — BUPROPION HCL 100 MG PO TABS
100.0000 mg | ORAL_TABLET | Freq: Three times a day (TID) | ORAL | Status: DC
  Filled 2019-07-21 (×2): qty 2

## 2019-07-21 MED ORDER — DIPHENOXYLATE-ATROPINE 2.5-0.025 MG PO TABS: 2.0000 | ORAL_TABLET | Freq: Four times a day (QID) | ORAL | Status: DC

## 2019-07-21 MED ORDER — GADOBUTROL 1 MMOL/ML IV SOLN
7.0000 mL | Freq: Once | INTRAVENOUS | Status: AC | PRN
  Administered 2019-07-21: 7 mL via INTRAVENOUS
  Filled 2019-07-21: qty 7.5

## 2019-07-21 MED ORDER — SIMVASTATIN 10 MG PO TABS
20.0000 mg | ORAL_TABLET | Freq: Every day | ORAL | Status: DC
  Administered 2019-07-21: 20 mg via ORAL
  Filled 2019-07-21: qty 2

## 2019-07-21 MED ORDER — LORAZEPAM 2 MG/ML IJ SOLN
1.0000 mg | Freq: Once | INTRAMUSCULAR | Status: AC
  Administered 2019-07-21: 1 mg via INTRAVENOUS

## 2019-07-21 NOTE — ED Notes (Signed)
Pt returned from MRI °

## 2019-07-21 NOTE — ED Notes (Signed)
When RN went in room to give pt her morning medication, pt asked about some of her medications that she did not get. Pt states that she takes Marinol 5 mg BID, Lomotil 2 tablets 4 times daily, and Wellbutrin 200 mg QAM, 100 mg at noon. And 100 mg at bedtime. Pt states that she is concerned because she has missed 2 doses of her Wellbutrin. Pt states that pharmacy did do her med rec and she informed then that she is currently taking these mediations.  RN reviewed MAR and these medication were not currently ordered. Secure chat send to Dr. Maryfrances Bunnell requesting order for these medication. Awaiting response.

## 2019-07-21 NOTE — ED Notes (Signed)
Dr. Maryfrances Bunnell at bedside.  Pt provided lunch meal tray. Sitting up at this time.

## 2019-07-21 NOTE — ED Notes (Signed)
Pt taken to MRI via stretcher.  

## 2019-07-21 NOTE — Discharge Summary (Signed)
Physician Discharge Summary  PERSIA LINTNER OIN:[id number] DOB: Feb 12, 1971 DOA: 07/20/2019  PCP: Zenaida Niece, MD  Admit date: 07/20/2019 Discharge date: 07/21/2019  Admitted From: Home  Disposition:  Home   Recommendations for Outpatient Follow-up:  1. Follow up with PCP as needed 2. Agree with Headache Clinic referral, if symptoms difficult to control     Home Health: None  Equipment/Devices: None  Discharge Condition: Good  CODE STATUS: FULL Diet recommendation: Regular  Brief/Interim Summary: Mrs. Arment is a 49 y.o. F with history gastric bypass, ulcers, OSA, migraines, and HTN who presented with left facial and shoulder numbness/tingling as well as paresthesias in bilateral hands.  Patient initially developed some numbness around the shoulder, which spread to both fingertips several days PTA.  While having dinner on the day of admission, she noticed left facial numbness and so she came to the ER.  Denied headache, although did take one of her migraine medicines before coming to the ER.  In the ER, blood pressure normal, MRI brain showed no infarct.  MRI C-spine showed some moderate foraminal narrowing at C5-C6 and some mild spondylosis without cord compression or myelopathy.       PRINCIPAL HOSPITAL DIAGNOSIS: Complex migraine    Discharge Diagnoses:   Complex migraine The patient was admitted to observation for work up of TIA.  She was evaluated by neurology, who felt TIA was unlikely.after ruling out multiple sclerosis with MRI of the brain and C-spine with contrast, it was felt that her symptoms were much more consistent with complex migraine.            Discharge Instructions  Discharge Instructions    Discharge instructions   Complete by: As directed    You were admitted for face and arm numbness, for which we were initially concerned about stroke.  We obtained an MRI of the brain and cervical spine with and without contrast,  which ruled out stroke, cancer, abscess, bleeding.  There were not signs to suggest multiple sclerosis nor other neurological defects of the spine. You have some degenerative change of the spine in your neck (ie essentially like arthritis) which is within the range of normal for your age, but would not account for your face and neck numbness, and so are likely just incidental and asymptomatic findings.  In light of that, we suspect this was not a TIA (mini-stroke) but was rather a "complicated migraine" or "compex migraine".  I encourage you to read up about these.  They are treated identically to normal migraines, with abortive medicines and rest.    Resume your normal medicines  Follow up with your primary care doctor as previously scheduled, sooner if your symptoms persist or worsen.   Increase activity slowly   Complete by: As directed      Allergies as of 07/21/2019      Reactions   Erythromycin Nausea And Vomiting   Nsaids       Medication List    TAKE these medications   acarbose 25 MG tablet Commonly known as: PRECOSE Take 25 mg by mouth as needed.   acetaminophen 160 MG/5ML solution Commonly known as: TYLENOL Take 15 mg/kg by mouth every 4 (four) hours as needed.   amitriptyline 100 MG tablet Commonly known as: ELAVIL Take 100 mg by mouth at bedtime.   buPROPion 100 MG tablet Commonly known as: WELLBUTRIN Take 100-200 mg by mouth 3 (three) times daily. 200mg  qam  100mg  at noon 100mg  at bedtime   butalbital-acetaminophen-caffeine 50-325-40  MG tablet Commonly known as: FIORICET Take 2 tablets by mouth every 4 (four) hours as needed.   Calcium Carb-Cholecalciferol 500-400 MG-UNIT Chew Chew 1 tablet by mouth daily.   Carafate 1 GM/10ML suspension Generic drug: sucralfate Take 1 g by mouth as needed.   cyanocobalamin 1000 MCG/ML injection Commonly known as: (VITAMIN B-12) cyanocobalamin (vit B-12) 1,000 mcg/mL injection solution every 60 days   dexlansoprazole  60 MG capsule Commonly known as: DEXILANT Take 60 mg by mouth at bedtime.   diphenoxylate-atropine 2.5-0.025 MG tablet Commonly known as: LOMOTIL Take 2 tablets by mouth 4 (four) times daily.   dronabinol 5 MG capsule Commonly known as: MARINOL Take 5 mg by mouth 2 (two) times daily before lunch and supper.   gabapentin 250 MG/5ML solution Commonly known as: NEURONTIN Take 900 mg by mouth at bedtime.   Hyoscyamine Sulfate SL 0.125 MG Subl Take 1 tablet by mouth as needed.   morphine 10 MG/5ML solution Take 12.5 mg by mouth every 4 (four) hours as needed.   Multiple Vitamins tablet Take 1 tablet by mouth daily.   promethazine 25 MG suppository Commonly known as: PHENERGAN Place 25 mg rectally as needed.   Promethazine HCl 6.25 MG/5ML Soln Take 20 mLs by mouth as needed.       Allergies  Allergen Reactions  . Erythromycin Nausea And Vomiting  . Nsaids     Consultations:  Neurology   Procedures/Studies: DG Chest 2 View  Result Date: 07/20/2019 CLINICAL DATA:  Facial numbness EXAM: CHEST - 2 VIEW COMPARISON:  07/26/2013 FINDINGS: The heart size and mediastinal contours are within normal limits. Both lungs are clear. The visualized skeletal structures are unremarkable. IMPRESSION: No active cardiopulmonary disease. Electronically Signed   By: Duanne Guess D.O.   On: 07/20/2019 20:19   MR BRAIN WO CONTRAST  Result Date: 07/20/2019 CLINICAL DATA:  Left facial numbness and left arm numbness. EXAM: MRI HEAD WITHOUT CONTRAST TECHNIQUE: Multiplanar, multiecho pulse sequences of the brain and surrounding structures were obtained without intravenous contrast. COMPARISON:  Head CT same day FINDINGS: Brain: The brain has a normal appearance without evidence of malformation, atrophy, old or acute small or large vessel infarction, mass lesion, hemorrhage, hydrocephalus or extra-axial collection. Vascular: Major vessels at the base of the brain show flow. Venous sinuses appear  patent. Skull and upper cervical spine: Normal. Sinuses/Orbits: Clear/normal. Other: None significant. IMPRESSION: Normal brain MRI Electronically Signed   By: Paulina Fusi M.D.   On: 07/20/2019 21:29   MR BRAIN W CONTRAST  Result Date: 07/21/2019 CLINICAL DATA:  Demyelinating disease. EXAM: MRI HEAD WITH CONTRAST TECHNIQUE: Multiplanar, multiecho pulse sequences of the brain and surrounding structures were obtained with intravenous contrast. CONTRAST:  23mL GADAVIST GADOBUTROL 1 MMOL/ML IV SOLN COMPARISON:  MR head without contrast 07/19/2018 FINDINGS: Brain: Postcontrast images demonstrate no pathologic enhancement. No focal lesions are present. The dura images are within normal limits. Vascular: Normal contrast enhancement Skull and upper cervical spine: Enhancing lesions are present. Calvarium and scalp are within normal limits. Sinuses/Orbits: No abnormal enhancement is present in the sinuses or orbits. IMPRESSION: Normal postcontrast MRI appearance of the brain. Electronically Signed   By: Marin Roberts M.D.   On: 07/21/2019 12:07   MR CERVICAL SPINE WO CONTRAST  Result Date: 07/20/2019 CLINICAL DATA:  Left face and arm numbness. EXAM: MRI CERVICAL SPINE WITHOUT CONTRAST TECHNIQUE: Multiplanar, multisequence MR imaging of the cervical spine was performed. No intravenous contrast was administered. COMPARISON:  None. FINDINGS: Alignment: Normal Vertebrae:  Normal Cord: Normal Posterior Fossa, vertebral arteries, paraspinal tissues: Normal Disc levels: No abnormality from the foramen magnum through C4-5. Canal and foramina widely patent. C5-6: Spondylosis with endplate osteophytes and moderate disc protrusion. Canal narrowing with AP diameter in the midline 8 mm. Narrowing of the subarachnoid space but no frank compression of the cord. Mild foraminal narrowing on the right and moderate foraminal narrowing on the left. C6-7: Mild disc bulge.  No canal or foraminal stenosis. C7-T1: Normal interspace.  IMPRESSION: No cord abnormality. C5-6: Spondylosis with endplate osteophytes and moderate disc protrusion. Narrowing of the ventral subarachnoid space but no compression of the cord. AP diameter of the canal 8 mm. Mild foraminal narrowing on the right and moderate foraminal narrowing on the left. Some possibility that this could in particular affect the left C6 nerve. C6-7: Mild noncompressive disc bulge. Electronically Signed   By: Nelson Chimes M.D.   On: 07/20/2019 21:31   MR CERVICAL SPINE W CONTRAST  Result Date: 07/21/2019 CLINICAL DATA:  Demyelinating disease. EXAM: MRI CERVICAL SPINE WITH CONTRAST TECHNIQUE: Multiplanar, multisequence MR imaging of the cervical spine was performed following the administration of intravenous contrast. COMPARISON:  MRI of the cervical spine without contrast 07/20/2019 FINDINGS: Alignment: Stable Vertebrae: No enhancing lesions are present. Vertebral body heights are maintained. Cord: There is no abnormal enhancement of the cord. No parenchymal enhancement is present. Anterior spinal artery is within normal limits. Posterior Fossa, vertebral arteries, paraspinal tissues: Craniocervical junction is normal. Flow is present in the vertebral arteries bilaterally. Visualized intracranial contents are normal. IMPRESSION: No parenchymal enhancement of the cord to suggest malignancy or demyelinating disease. Electronically Signed   By: San Morelle M.D.   On: 07/21/2019 12:02   US Carotid Bilateral (at [facility] and [facility] only)  Result Date: 07/21/2019 CLINICAL DATA:  TIA, left facial numbness and arm numbness. hypertension, hyperlipidemia , diabetes EXAM: BILATERAL CAROTID DUPLEX ULTRASOUND TECHNIQUE: Pearline Cables scale imaging, color Doppler and duplex ultrasound were performed of bilateral carotid and vertebral arteries in the neck. COMPARISON:  None. FINDINGS: Criteria: Quantification of carotid stenosis is based on velocity parameters that correlate the residual internal carotid  diameter with NASCET-based stenosis levels, using the diameter of the distal internal carotid lumen as the denominator for stenosis measurement. The following velocity measurements were obtained: RIGHT ICA: 79/38 cm/sec CCA: 92/42 cm/sec SYSTOLIC ICA/CCA RATIO:  1.3 ECA: 67 cm/sec LEFT ICA: 82/44 cm/sec CCA: 68/34 cm/sec SYSTOLIC ICA/CCA RATIO:  1.1 ECA: 58 cm/sec RIGHT CAROTID ARTERY: mild intimal thickening in the common carotid artery. no significant plaque accumulation or stenosis. Normal waveforms and color Doppler signal. RIGHT VERTEBRAL ARTERY:  Normal flow direction and waveform. LEFT CAROTID ARTERY: Mild intimal thickening in the bulb. No significant plaque or stenosis. Normal waveforms and color Doppler signal. LEFT VERTEBRAL ARTERY:  Normal flow direction and waveform. IMPRESSION: 1. No significant carotid plaque or stenosis. 2. Antegrade bilateral vertebral arterial flow. Electronically Signed   By: Lucrezia Europe M.D.   On: 07/21/2019 11:14   CT HEAD CODE STROKE WO CONTRAST  Result Date: 07/20/2019 CLINICAL DATA:  Code stroke. Left facial numbness beginning 1625 hours. EXAM: CT HEAD WITHOUT CONTRAST TECHNIQUE: Contiguous axial images were obtained from the base of the skull through the vertex without intravenous contrast. COMPARISON:  None. FINDINGS: Brain: Normal appearance of the brain without evidence of old or acute infarction, mass lesion, hemorrhage, hydrocephalus or extra-axial collection. Vascular: No abnormal vascular finding. Skull: Normal Sinuses/Orbits: Clear/normal Other: None ASPECTS ([place] Stroke Program Early CT Score) -  Ganglionic level infarction (caudate, lentiform nuclei, internal capsule, insula, M1-M3 cortex): 7 - Supraganglionic infarction (M4-M6 cortex): 3 Total score (0-10 with 10 being normal): 10 IMPRESSION: 1. Normal head CT 2. ASPECTS is 10. 3. Call report in progress at 1712 hours.  Currently no answer. Electronically Signed   By: Paulina Fusi M.D.   On: 07/20/2019 17:12       Subjective: No speech deficits, no focal weakness.  No loss of consciousness or headache.  Discharge Exam: Vitals:   07/21/19 1200 07/21/19 1300  BP: 122/85 124/81  Pulse: 93 (!) 103  Resp: 10 19  Temp:    SpO2: 98% 100%   Vitals:   07/21/19 1031 07/21/19 1100 07/21/19 1200 07/21/19 1300  BP: (!) 133/112 128/73 122/85 124/81  Pulse: 89 95 93 (!) 103  Resp: 18 (!) 21 10 19   Temp:      TempSrc:      SpO2: 97% 98% 98% 100%  Weight:      Height:        General: Pt is alert, awake, not in acute distress Cardiovascular: RRR, nl S1-S2, no murmurs appreciated.   No LE edema.   Respiratory: Normal respiratory rate and rhythm.  CTAB without rales or wheezes. Abdominal: Abdomen soft and non-tender.  No distension or HSM.   Neuro/Psych: Strength symmetric in upper and lower extremities.  Judgment and insight appear normal.   The results of significant diagnostics from this hospitalization (including imaging, microbiology, ancillary and laboratory) are listed below for reference.     Microbiology: Recent Results (from the past 240 hour(s))  SARS CORONAVIRUS 2 (TAT 6-24 HRS) Nasopharyngeal Nasopharyngeal Swab     Status: None   Collection Time: 07/20/19  9:45 PM   Specimen: Nasopharyngeal Swab  Result Value Ref Range Status   SARS Coronavirus 2 NEGATIVE NEGATIVE Final    Comment: (NOTE) SARS-CoV-2 target nucleic acids are NOT DETECTED. The SARS-CoV-2 RNA is generally detectable in upper and lower respiratory specimens during the acute phase of infection. Negative results do not preclude SARS-CoV-2 infection, do not rule out co-infections with other pathogens, and should not be used as the sole basis for treatment or other patient management decisions. Negative results must be combined with clinical observations, patient history, and epidemiological information. The expected result is Negative. Fact Sheet for Patients: 07/22/19 Fact  Sheet for Healthcare Providers: HairSlick.no This test is not yet approved or cleared by the [place] FDA and  has been authorized for detection and/or diagnosis of SARS-CoV-2 by FDA under an Emergency Use Authorization (EUA). This EUA will remain  in effect (meaning this test can be used) for the duration of the COVID-19 declaration under Section 56 4(b)(1) of the Act, 21 U.S.C. section 360bbb-3(b)(1), unless the authorization is terminated or revoked sooner. Performed at [facility], [address], [place], [place]      Labs: BNP (last 3 results) No results for input(s): BNP in the last 8760 hours. Basic Metabolic Panel: Recent Labs  Lab 07/20/19 1721  NA 138  K 4.0  CL 103  CO2 27  GLUCOSE 103*  BUN 11  CREATININE 0.79  CALCIUM 9.1   Liver Function Tests: Recent Labs  Lab 07/20/19 1721  AST 19  ALT 21  ALKPHOS 95  BILITOT 0.3  PROT 8.0  ALBUMIN 4.4   No results for input(s): LIPASE, AMYLASE in the last 168 hours. No results for input(s): AMMONIA in the last 168 hours. CBC: Recent Labs  Lab 07/20/19 1721  WBC 6.5  NEUTROABS 4.0  HGB 12.9  HCT 40.4  MCV 93.3  PLT 289   Cardiac Enzymes: No results for input(s): CKTOTAL, CKMB, CKMBINDEX, TROPONINI in the last 168 hours. BNP: Invalid input(s): POCBNP CBG: Recent Labs  Lab 07/20/19 1714  GLUCAP 91   D-Dimer No results for input(s): DDIMER in the last 72 hours. Hgb A1c Recent Labs    07/21/19 0441  HGBA1C 4.8   Lipid Profile Recent Labs    07/21/19 0441  CHOL 154  HDL 42  LDLCALC 84  TRIG 139  CHOLHDL 3.7   Thyroid function studies No results for input(s): TSH, T4TOTAL, T3FREE, THYROIDAB in the last 72 hours.  Invalid input(s): FREET3 Anemia work up No results for input(s): VITAMINB12, FOLATE, FERRITIN, TIBC, IRON, RETICCTPCT in the last 72 hours. Urinalysis    Component Value Date/Time   BILIRUBINUR negative 06/25/2019 1236    KETONESUR negative 06/25/2019 1236   PROTEINUR negative 06/25/2019 1236   UROBILINOGEN 0.2 06/25/2019 1236   NITRITE Negative 06/25/2019 1236   LEUKOCYTESUR Large (3+) (A) 06/25/2019 1236   Sepsis Labs Invalid input(s): PROCALCITONIN,  WBC,  LACTICIDVEN Microbiology Recent Results (from the past 240 hour(s))  SARS CORONAVIRUS 2 (TAT 6-24 HRS) Nasopharyngeal Nasopharyngeal Swab     Status: None   Collection Time: 07/20/19  9:45 PM   Specimen: Nasopharyngeal Swab  Result Value Ref Range Status   SARS Coronavirus 2 NEGATIVE NEGATIVE Final    Comment: (NOTE) SARS-CoV-2 target nucleic acids are NOT DETECTED. The SARS-CoV-2 RNA is generally detectable in upper and lower respiratory specimens during the acute phase of infection. Negative results do not preclude SARS-CoV-2 infection, do not rule out co-infections with other pathogens, and should not be used as the sole basis for treatment or other patient management decisions. Negative results must be combined with clinical observations, patient history, and epidemiological information. The expected result is Negative. Fact Sheet for Patients: HairSlick.nohttps://www.fda.gov/media/138098/download Fact Sheet for Healthcare Providers: quierodirigir.comhttps://www.fda.gov/media/138095/download This test is not yet approved or cleared by the [place] FDA and  has been authorized for detection and/or diagnosis of SARS-CoV-2 by FDA under an Emergency Use Authorization (EUA). This EUA will remain  in effect (meaning this test can be used) for the duration of the COVID-19 declaration under Section 56 4(b)(1) of the Act, 21 U.S.C. section 360bbb-3(b)(1), unless the authorization is terminated or revoked sooner. Performed at [facility], [address] [place], [place] [address]      Time coordinating discharge: 25 minutes The [place] controlled substances registry was reviewed for this patient     SIGNED:   Alberteen Samhristopher P Anzleigh Slaven, MD  [facility] 07/21/2019, 4:32 PM

## 2019-07-21 NOTE — ED Notes (Signed)
Patient transported to MRI 

## 2019-07-21 NOTE — ED Notes (Signed)
hospitalist at bedside

## 2019-07-21 NOTE — ED Notes (Signed)
Pt states she is missing nighttime meds and that she usually takes more of the morphine and Fioricet and PO phenergan than ordered. Called pharmacy per pt request- pharmacy busy but state they will call to speak with pt.

## 2019-07-21 NOTE — ED Notes (Addendum)
Report given to Darnelle Maffucci, RN

## 2019-07-21 NOTE — Consult Note (Signed)
Requesting Physician: Dr. Maryfrances Bunnell    Chief Complaint: Left face numbness and left hand and shoulder numbness  History obtained from: Patient and Chart  HPI:                                                                                                                                       Sharon Dominguez is a 49 y.o. female with past medical history significant for GERD, hypertension, gastric ulcers, depression, obstructive sleep apnea and headaches presents the emergency department after developing left facial numbness on 1/16 around dinnertime.  She states that since 1/14 she has noticed numbness around her shoulder and her arm which eventually spread to both fingertips.  While having dinner yesterday, she noticed the entirety of her left half of her face was numb and was concerned and presented to the emergency room. She denies associated headache with the symptoms although did describe she took a Fioricet just prior to coming to the emergency room.  She does have a history of headaches that appear on description to be migraines although with no prior diagnosis.  She characterizes her headaches approximately once or twice per week/usually unilateral, throbbing associated with photophobia and nausea.  Is relieved with Fioricet.  Her BP was not elevated was 135/94 mmHg.  She was evaluated by tele-neurology who recommended admission for stroke work-up.  MRI brain was performed which was negative for any acute findings.  MRI C-spine also performed which showed no evidence of cord compression, moderate foraminal narrowing at C5-C6 and some mild spondylosis.   Patient still is complaining of some numbness around her chest and shoulder.   History reviewed. No pertinent past medical history.  Past Surgical History:  Procedure Laterality Date  . GASTRECTOMY      No family history on file. Social History:  reports that she has never smoked. She has never used smokeless tobacco. She reports  current alcohol use. She reports that she does not use drugs.  Allergies:  Allergies  Allergen Reactions  . Erythromycin Nausea And Vomiting  . Nsaids     Medications:                                                                                                                        I reviewed home medications   ROS:  14 systems reviewed and negative except above    Examination:                                                                                                      General: Appears well-developed and well-nourished.  Psych: Affect appropriate to situation Eyes: No scleral injection HENT: No OP obstrucion Head: Normocephalic.  Cardiovascular: Normal rate and regular rhythm.  Respiratory: Effort normal and breath sounds normal to anterior ascultation GI: Soft.  No distension. There is no tenderness.  Skin: WDI    Neurological Examination Mental Status: Alert, oriented, thought content appropriate.  Speech fluent without evidence of aphasia. Able to follow 3 step commands without difficulty. Cranial Nerves: II: Visual fields grossly normal,  III,IV, VI: ptosis not present, extra-ocular motions intact bilaterally, pupils equal, round, reactive to light and accommodation V,VII: smile symmetric, facial light touch sensation normal bilaterally VIII: hearing normal bilaterally IX,X: uvula rises symmetrically XI: bilateral shoulder shrug XII: midline tongue extension Motor: Right : Upper extremity   5/5    Left:     Upper extremity   5/5  Lower extremity   5/5     Lower extremity   5/5 Tone and bulk:normal tone throughout; no atrophy noted Sensory: Pinprick and light touch intact throughout, bilaterally, complaints of subjective paresthesia around chest and left shoulder Deep Tendon Reflexes: 2+ and symmetric  throughout Plantars: Right: downgoing   Left: downgoing Cerebellar: normal finger-to-nose, normal rapid alternating movements and normal heel-to-shin test Gait: normal gait and station     Lab Results: Basic Metabolic Panel: Recent Labs  Lab 07/20/19 1721  NA 138  K 4.0  CL 103  CO2 27  GLUCOSE 103*  BUN 11  CREATININE 0.79  CALCIUM 9.1    CBC: Recent Labs  Lab 07/20/19 1721  WBC 6.5  NEUTROABS 4.0  HGB 12.9  HCT 40.4  MCV 93.3  PLT 289    Coagulation Studies: Recent Labs    07/20/19 1721  LABPROT 12.1  INR 0.9    Imaging: DG Chest 2 View  Result Date: 07/20/2019 CLINICAL DATA:  Facial numbness EXAM: CHEST - 2 VIEW COMPARISON:  07/26/2013 FINDINGS: The heart size and mediastinal contours are within normal limits. Both lungs are clear. The visualized skeletal structures are unremarkable. IMPRESSION: No active cardiopulmonary disease. Electronically Signed   By: Duanne Guess D.O.   On: 07/20/2019 20:19   MR BRAIN WO CONTRAST  Result Date: 07/20/2019 CLINICAL DATA:  Left facial numbness and left arm numbness. EXAM: MRI HEAD WITHOUT CONTRAST TECHNIQUE: Multiplanar, multiecho pulse sequences of the brain and surrounding structures were obtained without intravenous contrast. COMPARISON:  Head CT same day FINDINGS: Brain: The brain has a normal appearance without evidence of malformation, atrophy, old or acute small or large vessel infarction, mass lesion, hemorrhage, hydrocephalus or extra-axial collection. Vascular: Major vessels at the base of the brain show flow. Venous sinuses appear patent. Skull and upper cervical spine: Normal. Sinuses/Orbits: Clear/normal. Other: None significant. IMPRESSION: Normal brain MRI Electronically Signed   By: Paulina Fusi M.D.   On: 07/20/2019  21:29   MR CERVICAL SPINE WO CONTRAST  Result Date: 07/20/2019 CLINICAL DATA:  Left face and arm numbness. EXAM: MRI CERVICAL SPINE WITHOUT CONTRAST TECHNIQUE: Multiplanar, multisequence  MR imaging of the cervical spine was performed. No intravenous contrast was administered. COMPARISON:  None. FINDINGS: Alignment: Normal Vertebrae: Normal Cord: Normal Posterior Fossa, vertebral arteries, paraspinal tissues: Normal Disc levels: No abnormality from the foramen magnum through C4-5. Canal and foramina widely patent. C5-6: Spondylosis with endplate osteophytes and moderate disc protrusion. Canal narrowing with AP diameter in the midline 8 mm. Narrowing of the subarachnoid space but no frank compression of the cord. Mild foraminal narrowing on the right and moderate foraminal narrowing on the left. C6-7: Mild disc bulge.  No canal or foraminal stenosis. C7-T1: Normal interspace. IMPRESSION: No cord abnormality. C5-6: Spondylosis with endplate osteophytes and moderate disc protrusion. Narrowing of the ventral subarachnoid space but no compression of the cord. AP diameter of the canal 8 mm. Mild foraminal narrowing on the right and moderate foraminal narrowing on the left. Some possibility that this could in particular affect the left C6 nerve. C6-7: Mild noncompressive disc bulge. Electronically Signed   By: Nelson Chimes M.D.   On: 07/20/2019 21:31   US Carotid Bilateral (at [facility] and AP only)  Result Date: 07/21/2019 CLINICAL DATA:  TIA, left facial numbness and arm numbness. hypertension, hyperlipidemia , diabetes EXAM: BILATERAL CAROTID DUPLEX ULTRASOUND TECHNIQUE: Pearline Cables scale imaging, color Doppler and duplex ultrasound were performed of bilateral carotid and vertebral arteries in the neck. COMPARISON:  None. FINDINGS: Criteria: Quantification of carotid stenosis is based on velocity parameters that correlate the residual internal carotid diameter with NASCET-based stenosis levels, using the diameter of the distal internal carotid lumen as the denominator for stenosis measurement. The following velocity measurements were obtained: RIGHT ICA: 79/38 cm/sec CCA: 16/60 cm/sec SYSTOLIC ICA/CCA RATIO:   1.3 ECA: 67 cm/sec LEFT ICA: 82/44 cm/sec CCA: 63/01 cm/sec SYSTOLIC ICA/CCA RATIO:  1.1 ECA: 58 cm/sec RIGHT CAROTID ARTERY: mild intimal thickening in the common carotid artery. no significant plaque accumulation or stenosis. Normal waveforms and color Doppler signal. RIGHT VERTEBRAL ARTERY:  Normal flow direction and waveform. LEFT CAROTID ARTERY: Mild intimal thickening in the bulb. No significant plaque or stenosis. Normal waveforms and color Doppler signal. LEFT VERTEBRAL ARTERY:  Normal flow direction and waveform. IMPRESSION: 1. No significant carotid plaque or stenosis. 2. Antegrade bilateral vertebral arterial flow. Electronically Signed   By: Lucrezia Europe M.D.   On: 07/21/2019 11:14   CT HEAD CODE STROKE WO CONTRAST  Result Date: 07/20/2019 CLINICAL DATA:  Code stroke. Left facial numbness beginning 1625 hours. EXAM: CT HEAD WITHOUT CONTRAST TECHNIQUE: Contiguous axial images were obtained from the base of the skull through the vertex without intravenous contrast. COMPARISON:  None. FINDINGS: Brain: Normal appearance of the brain without evidence of old or acute infarction, mass lesion, hemorrhage, hydrocephalus or extra-axial collection. Vascular: No abnormal vascular finding. Skull: Normal Sinuses/Orbits: Clear/normal Other: None ASPECTS ([place] Stroke Program Early CT Score) - Ganglionic level infarction (caudate, lentiform nuclei, internal capsule, insula, M1-M3 cortex): 7 - Supraganglionic infarction (M4-M6 cortex): 3 Total score (0-10 with 10 being normal): 10 IMPRESSION: 1. Normal head CT 2. ASPECTS is 10. 3. Call report in progress at 1712 hours.  Currently no answer. Electronically Signed   By: Nelson Chimes M.D.   On: 07/20/2019 17:12     I have reviewed the above imaging :  MRI brain and MRI C-spine: Appear normal  ASSESSMENT AND PLAN  Left-sided paresthesias Possibly complicated migraine  Recommendations Can DC Plavix as this is not an acute stroke or TIA (still has symptoms  with MRI being negative) MRI brain with contrast and MRI C-spine with contrast to look for any evidence of radiculitis/acute demyelination Currently patient not complaining of a headache, will hold off migraine cocktail Counseled on not using excessive Fioricet If headaches continue, consider starting Topamax 25 mg nightly for 1 week and  and then 25 mg twice daily  If negative patient can be discharged with outpatient neurology follow-up.  Jerita Wimbush [facility] Pager Number [phone]

## 2023-06-04 ENCOUNTER — Ambulatory Visit: Payer: Self-pay
